# Patient Record
Sex: Male | Born: 1935 | Race: White | Hispanic: No | Marital: Married | Smoking: Never smoker
Health system: Southern US, Community
[De-identification: ages and names within clinical notes are randomized; demographics above are authoritative.]

## PROBLEM LIST (undated history)

## (undated) DIAGNOSIS — I480 Paroxysmal atrial fibrillation: Secondary | ICD-10-CM

## (undated) DIAGNOSIS — K219 Gastro-esophageal reflux disease without esophagitis: Secondary | ICD-10-CM

## (undated) DIAGNOSIS — I2510 Atherosclerotic heart disease of native coronary artery without angina pectoris: Secondary | ICD-10-CM

## (undated) DIAGNOSIS — J321 Chronic frontal sinusitis: Secondary | ICD-10-CM

## (undated) DIAGNOSIS — J42 Unspecified chronic bronchitis: Secondary | ICD-10-CM

## (undated) DIAGNOSIS — R7302 Impaired glucose tolerance (oral): Secondary | ICD-10-CM

## (undated) DIAGNOSIS — J449 Chronic obstructive pulmonary disease, unspecified: Secondary | ICD-10-CM

## (undated) DIAGNOSIS — M1990 Unspecified osteoarthritis, unspecified site: Secondary | ICD-10-CM

## (undated) DIAGNOSIS — N400 Enlarged prostate without lower urinary tract symptoms: Secondary | ICD-10-CM

## (undated) DIAGNOSIS — G4733 Obstructive sleep apnea (adult) (pediatric): Secondary | ICD-10-CM

## (undated) DIAGNOSIS — F419 Anxiety disorder, unspecified: Secondary | ICD-10-CM

## (undated) DIAGNOSIS — R079 Chest pain, unspecified: Secondary | ICD-10-CM

## (undated) DIAGNOSIS — Z9989 Dependence on other enabling machines and devices: Secondary | ICD-10-CM

## (undated) DIAGNOSIS — I252 Old myocardial infarction: Secondary | ICD-10-CM

## (undated) DIAGNOSIS — F0390 Unspecified dementia without behavioral disturbance: Secondary | ICD-10-CM

## (undated) DIAGNOSIS — R0600 Dyspnea, unspecified: Secondary | ICD-10-CM

## (undated) DIAGNOSIS — T23269A Burn of second degree of back of unspecified hand, initial encounter: Secondary | ICD-10-CM

## (undated) HISTORY — DX: Burn of second degree of back of unspecified hand, initial encounter: T23.269A

## (undated) HISTORY — DX: Unspecified chronic bronchitis: J42

## (undated) HISTORY — DX: Atherosclerotic heart disease of native coronary artery without angina pectoris: I25.10

## (undated) HISTORY — PX: HERNIA REPAIR: SHX51

## (undated) HISTORY — DX: Anxiety disorder, unspecified: F41.9

## (undated) HISTORY — DX: Chronic frontal sinusitis: J32.1

## (undated) HISTORY — DX: Old myocardial infarction: I25.2

## (undated) HISTORY — DX: Paroxysmal atrial fibrillation: I48.0

## (undated) HISTORY — DX: Unspecified osteoarthritis, unspecified site: M19.90

## (undated) HISTORY — PX: APPENDECTOMY: SHX54

## (undated) HISTORY — DX: Impaired glucose tolerance (oral): R73.02

## (undated) HISTORY — DX: Chest pain, unspecified: R07.9

## (undated) HISTORY — DX: Dependence on other enabling machines and devices: Z99.89

## (undated) HISTORY — PX: CARDIAC CATHETERIZATION: SHX172

## (undated) HISTORY — DX: Obstructive sleep apnea (adult) (pediatric): G47.33

---

## 2008-12-30 ENCOUNTER — Inpatient Hospital Stay: Payer: Self-pay | Admitting: Internal Medicine

## 2009-12-10 ENCOUNTER — Inpatient Hospital Stay: Payer: Self-pay | Admitting: Student

## 2010-08-20 DIAGNOSIS — I2510 Atherosclerotic heart disease of native coronary artery without angina pectoris: Secondary | ICD-10-CM

## 2010-08-20 HISTORY — DX: Atherosclerotic heart disease of native coronary artery without angina pectoris: I25.10

## 2010-12-04 ENCOUNTER — Ambulatory Visit: Payer: Self-pay | Admitting: Internal Medicine

## 2010-12-05 ENCOUNTER — Inpatient Hospital Stay: Payer: Self-pay | Admitting: Internal Medicine

## 2010-12-24 ENCOUNTER — Ambulatory Visit: Payer: Self-pay | Admitting: Internal Medicine

## 2010-12-26 ENCOUNTER — Ambulatory Visit: Payer: Self-pay | Admitting: Internal Medicine

## 2011-03-16 DIAGNOSIS — Z9989 Dependence on other enabling machines and devices: Secondary | ICD-10-CM | POA: Insufficient documentation

## 2011-03-16 DIAGNOSIS — I480 Paroxysmal atrial fibrillation: Secondary | ICD-10-CM | POA: Insufficient documentation

## 2011-03-16 DIAGNOSIS — G4700 Insomnia, unspecified: Secondary | ICD-10-CM | POA: Insufficient documentation

## 2011-03-16 DIAGNOSIS — J42 Unspecified chronic bronchitis: Secondary | ICD-10-CM | POA: Insufficient documentation

## 2011-03-16 DIAGNOSIS — I482 Chronic atrial fibrillation, unspecified: Secondary | ICD-10-CM | POA: Insufficient documentation

## 2011-03-16 DIAGNOSIS — I2510 Atherosclerotic heart disease of native coronary artery without angina pectoris: Secondary | ICD-10-CM

## 2011-03-16 DIAGNOSIS — G4733 Obstructive sleep apnea (adult) (pediatric): Secondary | ICD-10-CM | POA: Insufficient documentation

## 2011-03-16 HISTORY — DX: Paroxysmal atrial fibrillation: I48.0

## 2011-03-16 HISTORY — DX: Atherosclerotic heart disease of native coronary artery without angina pectoris: I25.10

## 2011-03-16 HISTORY — DX: Obstructive sleep apnea (adult) (pediatric): G47.33

## 2011-03-16 HISTORY — DX: Unspecified chronic bronchitis: J42

## 2011-10-16 ENCOUNTER — Ambulatory Visit: Payer: Self-pay | Admitting: Specialist

## 2012-03-05 DIAGNOSIS — Z79899 Other long term (current) drug therapy: Secondary | ICD-10-CM | POA: Insufficient documentation

## 2012-04-10 ENCOUNTER — Ambulatory Visit: Payer: Self-pay | Admitting: Medical

## 2012-04-10 ENCOUNTER — Observation Stay: Payer: Self-pay | Admitting: Internal Medicine

## 2012-04-10 LAB — COMPREHENSIVE METABOLIC PANEL
Anion Gap: 7 (ref 7–16)
BUN: 19 mg/dL — ABNORMAL HIGH (ref 7–18)
Bilirubin,Total: 0.6 mg/dL (ref 0.2–1.0)
Chloride: 108 mmol/L — ABNORMAL HIGH (ref 98–107)
Co2: 28 mmol/L (ref 21–32)
Creatinine: 0.95 mg/dL (ref 0.60–1.30)
EGFR (African American): >60
EGFR (Non-African Amer.): >60
Glucose: 99 mg/dL (ref 65–99)
Osmolality: 287 (ref 275–301)
Potassium: 4 mmol/L (ref 3.5–5.1)
Sodium: 143 mmol/L (ref 136–145)

## 2012-04-10 LAB — CBC WITH DIFFERENTIAL/PLATELET
Basophil #: 0.1 10*3/uL (ref 0.0–0.1)
Basophil %: 1 %
Eosinophil #: 0.5 10*3/uL (ref 0.0–0.7)
HCT: 41.8 % (ref 40.0–52.0)
HGB: 14.1 g/dL (ref 13.0–18.0)
Lymphocyte #: 1.1 10*3/uL (ref 1.0–3.6)
Lymphocyte %: 15.6 %
MCH: 32.1 pg (ref 26.0–34.0)
MCHC: 33.8 g/dL (ref 32.0–36.0)
Monocyte #: 0.6 x10 3/mm (ref 0.2–1.0)
Neutrophil %: 66.5 %
RBC: 4.4 10*6/uL (ref 4.40–5.90)

## 2012-04-10 LAB — APTT: Activated PTT: 36.3 secs — ABNORMAL HIGH (ref 23.6–35.9)

## 2012-04-11 LAB — TROPONIN I: Troponin-I: <0.02 ng/mL

## 2012-07-28 DIAGNOSIS — F419 Anxiety disorder, unspecified: Secondary | ICD-10-CM | POA: Insufficient documentation

## 2012-07-28 HISTORY — DX: Anxiety disorder, unspecified: F41.9

## 2012-08-20 HISTORY — PX: CARDIOVASCULAR STRESS TEST: SHX262

## 2013-01-23 ENCOUNTER — Ambulatory Visit: Payer: Self-pay

## 2013-01-30 ENCOUNTER — Ambulatory Visit: Payer: Self-pay | Admitting: Family Medicine

## 2013-02-03 ENCOUNTER — Encounter: Payer: Self-pay | Admitting: Family Medicine

## 2013-06-23 ENCOUNTER — Ambulatory Visit: Payer: Self-pay | Admitting: Emergency Medicine

## 2013-07-27 DIAGNOSIS — T23269A Burn of second degree of back of unspecified hand, initial encounter: Secondary | ICD-10-CM | POA: Insufficient documentation

## 2013-07-27 HISTORY — DX: Burn of second degree of back of unspecified hand, initial encounter: T23.269A

## 2014-03-03 DIAGNOSIS — R413 Other amnesia: Secondary | ICD-10-CM | POA: Insufficient documentation

## 2014-05-17 DIAGNOSIS — I252 Old myocardial infarction: Secondary | ICD-10-CM

## 2014-05-17 DIAGNOSIS — J449 Chronic obstructive pulmonary disease, unspecified: Secondary | ICD-10-CM | POA: Insufficient documentation

## 2014-05-17 HISTORY — DX: Old myocardial infarction: I25.2

## 2014-05-19 DIAGNOSIS — I25709 Atherosclerosis of coronary artery bypass graft(s), unspecified, with unspecified angina pectoris: Secondary | ICD-10-CM | POA: Insufficient documentation

## 2014-09-20 ENCOUNTER — Emergency Department: Payer: Self-pay | Admitting: Emergency Medicine

## 2014-09-20 LAB — CBC
HCT: 42.9 % (ref 40.0–52.0)
HGB: 14.5 g/dL (ref 13.0–18.0)
MCH: 32.5 pg (ref 26.0–34.0)
MCHC: 33.7 g/dL (ref 32.0–36.0)
MCV: 96 fL (ref 80–100)
Platelet: 202 10*3/uL (ref 150–440)
RBC: 4.44 10*6/uL (ref 4.40–5.90)
RDW: 14.4 % (ref 11.5–14.5)
WBC: 5.6 10*3/uL (ref 3.8–10.6)

## 2014-09-20 LAB — COMPREHENSIVE METABOLIC PANEL
ALBUMIN: 3.5 g/dL (ref 3.4–5.0)
Alkaline Phosphatase: 74 U/L (ref 46–116)
Anion Gap: 8 (ref 7–16)
BUN: 14 mg/dL (ref 7–18)
Bilirubin,Total: 0.5 mg/dL (ref 0.2–1.0)
CALCIUM: 9.4 mg/dL (ref 8.5–10.1)
CO2: 27 mmol/L (ref 21–32)
Chloride: 105 mmol/L (ref 98–107)
Creatinine: 1.11 mg/dL (ref 0.60–1.30)
EGFR (African American): >60
EGFR (Non-African Amer.): >60
GLUCOSE: 104 mg/dL — AB (ref 65–99)
Osmolality: 280 (ref 275–301)
POTASSIUM: 3.8 mmol/L (ref 3.5–5.1)
SGOT(AST): 19 U/L (ref 15–37)
SGPT (ALT): 24 U/L (ref 14–63)
Sodium: 140 mmol/L (ref 136–145)
Total Protein: 7.2 g/dL (ref 6.4–8.2)

## 2014-09-20 LAB — TROPONIN I: Troponin-I: <0.02 ng/mL

## 2014-09-20 LAB — CK TOTAL AND CKMB (NOT AT ARMC)
CK, TOTAL: 98 U/L (ref 39–308)
CK-MB: 1.8 ng/mL (ref 0.5–3.6)

## 2014-11-08 ENCOUNTER — Ambulatory Visit: Payer: Self-pay | Admitting: Ophthalmology

## 2014-12-07 NOTE — Discharge Summary (Signed)
PATIENT NAME:  Keith Estes, [name]:  [id number] DATE OF BIRTH:  09-18-1928  DATE OF ADMISSION:  04/10/2012 DATE OF DISCHARGE:  04/11/2012  PRIMARY CARE PHYSICIAN: Dr. Rolm GalaHeidi Grandis  CARDIOLOGIST: Dr. Juliann Paresallwood  DISCHARGE DIAGNOSES:  1. Noncardiac chest pain, likely secondary to chronic obstructive pulmonary disease.  2. Coronary artery disease.  3. Chronic atrial fibrillation.  4. Stable chronic obstructive pulmonary disease.  5. Anxiety.   LABORATORY, DIAGNOSTIC AND RADIOLOGICAL DATA: Chest x-ray showed changes from chronic obstructive pulmonary disease. No acute abnormalities.   ADMITTING HISTORY AND PHYSICAL: Please see detailed history and physical dictated by Dr. Cherlynn Estes. In brief, 81 year old male patient with history of coronary artery disease who follows with Dr. Juliann Paresallwood of cardiology presented to the Emergency Room complaining of left-sided chest pain which is pleuritic but no shortness of breath, palpitations or lightheadedness. Did not have any swelling in his legs. No long car ride or flight journeys. No prior history of PE. Patient was admitted to the hospital for ruling out acute coronary syndrome.   HOSPITAL COURSE: Sinus chest pain. Patient's chest pain had significantly decreased by the time of discharge. He did not have any chest pain at rest but had some pain on deep breathing. Did not have any palpitations, shortness of breath. Telemetry showed no arrhythmias. EKG showed no acute changes. Cardiac enzymes have been normal. Chest x-ray showed chronic obstructive pulmonary disease and his chest pain was thought to be secondary to chronic obstructive pulmonary disease and patient is being discharged home in a stable condition to follow up with his primary care physician and cardiologist. On the day of discharge patient's temperature 96.6, heart rate 72, blood pressure 120/76, saturating 98% on room air and is discharged home in stable condition.   DISCHARGE MEDICATIONS:  1. Coreg  12.5 mg oral twice a day.  2. Cholecalciferol 1 tablet oral once a day.  3. Dofetilide 500 mcg 1 capsule oral every 12 hours.  4. Donepezil 10 mg oral once a day.  5. Ranitidine 150 mg oral twice a day.  6. Spiriva 18 mcg inhalation once a day.  7. Ventolin HFA 2 puffs inhaled 3 times a day.  8. Alprazolam 0.5 mg oral once a day as needed.  9. Fluticasone one spray in each nostril twice a day.  10. Nitroglycerin 0.4 mg sublingual as needed for chest pain.  11. Ascorbic acid 1 tablet oral once a day.  12. Pradaxa 150 mg oral twice a day.  13. Ranexa 500 mg oral 2 times a day.   DISCHARGE INSTRUCTIONS: Follow up with primary care physician, Dr. Rolm GalaHeidi Grandis, within a week and follow up with Dr. Juliann Paresallwood in 2 to 3 weeks. Patient will be on a cardiac diet with activity as tolerated with assistance. Patient is to return to the Emergency Room if she has any worsening shortness of breath or chest pain. This plan was discussed with the patient and his wife at bedside who verbalized understanding and are okay with the plan.   TIME SPENT: Time spent today on this discharge dictation along with coordinating care and counseling of the patient was 33 minutes.   ____________________________ Molinda BailiffSrikar R. Azalia Neuberger, MD srs:cms D: 04/11/2012 11:11:16 ET T: 04/11/2012 11:25:40 ET JOB#: [id number]  cc: Wardell HeathSrikar R. Elpidio AnisSudini, MD, <Dictator> Letitia CaulHeidi M. Grandis, MD Dwayne D. Juliann Paresallwood, MD Orie FishermanSRIKAR R Carlos Quackenbush MD ELECTRONICALLY SIGNED 04/18/2012 5:35

## 2014-12-07 NOTE — H&P (Signed)
PATIENT NAME:  Keith Estes, [name] MR#:  [id number] DATE OF BIRTH:  02/18/1929  DATE OF ADMISSION:  04/10/2012  PRIMARY CARE PHYSICIAN: Dr. Rolm GalaHeidi Grandis  CARDIOLOGIST: Dr. Juliann Paresallwood here locally but [facility]   CHIEF COMPLAINT: Chest pain.   HISTORY OF PRESENT ILLNESS: This is an 81 year old male who presents to the Emergency Room secondary to chest pain. Patient says he started developing chest pain earlier this morning, has been having intermittent chest pain on and off this entire day. Patient woke up this morning and had the chest pain. He describes it as sharp pain in the left side of the chest, nonradiating, not associated with any shortness of breath. No nausea, no vomiting, no diaphoresis, no palpitations, no syncope. Patient says that this pain is somewhat similar to his anginal pain that he has had in the past. He does have a significant cardiac history with multiple stents in the past. Given his significant risk factors hospitalist service was contacted for further treatment and evaluation.   REVIEW OF SYSTEMS: CONSTITUTIONAL: No documented fever. No weight gain, no weight loss. EYES: No blurred or double vision. ENT: No tinnitus. No postnasal drip. No redness of the oropharynx. RESPIRATORY: No cough, no wheeze, no hemoptysis, no dyspnea. CARDIOVASCULAR: Positive chest pain. No orthopnea, no palpitations, no syncope. GASTROINTESTINAL: No nausea, no vomiting, no diarrhea, no abdominal pain, no melena, no hematochezia. GENITOURINARY: No dysuria, no hematuria. ENDOCRINE: No polyuria or nocturia. No heat or cold intolerance. HEME: No anemia, no bruising, no bleeding. INTEGUMENTARY: No rashes. No lesions. MUSCULOSKELETAL: No arthritis, no swelling, no gout. NEUROLOGIC: No numbness, no tingling, no ataxia, no seizure-type activity. PSYCH: No anxiety, no insomnia, no ADD.   PAST MEDICAL HISTORY:  1. Coronary artery disease, status post multiple  stents. 2. History of chronic atrial fibrillation. 3. Dementia. 4. Chronic obstructive pulmonary disease. 5. Gastroesophageal reflux disease.  6. Hyperlipidemia.  7. Anxiety.   ALLERGIES: Lipitor, lisinopril, Percodan and Vicodin.   SOCIAL HISTORY: Patient denies any smoking, any alcohol abuse. No illicit drug abuse. Lives at home with his wife.   FAMILY HISTORY: Patient's mother died apparently from starvation. His father had chronic obstructive pulmonary disease.   CURRENT MEDICATIONS:  1. Albuterol inhaler 2 puffs t.i.d. as needed.  2. Xanax 0.5 mg at bedtime.  3. Vitamin C 500 mg daily.  4. Coreg 12.5 mg b.i.d.  5. Coenzyme Q 200 mg daily. 6. Pradaxa 150 mg b.i.d.  7. Dofetilide 500 mcg 1 cap b.i.d.  8. Aricept 10 mg daily.  9. Flonase one spray to both nostrils daily.  10. Ranitidine 150 mg b.i.d.  11. Ranexa 500 mg b.i.d.  12. Pravachol 20 mg daily.  13. Trazodone 50 mg at bedtime as needed.  14. Vitamin D3 1000 international units daily.  15. Vitamin B complex daily.  16. Vitamin C 1 tab b.i.d.  17. Patanol 1 drop b.i.d. to affected eye.  18. Sublingual nitroglycerin as needed.  19. Garlic supplement. 20. Herbal supplement.   PHYSICAL EXAMINATION:  VITAL SIGNS: Temperature 96.4, pulse 60, respirations 16, blood pressure 142/82, sats 95% on room air.   GENERAL: Patient is a pleasant appearing male in no apparent distress.   HEENT: Atraumatic, normocephalic. Extraocular muscles are intact. Pupils equal, reactive to light. Sclerae anicteric. No conjunctival injection. No oropharyngeal erythema.   NECK: Supple. There is no jugular venous distention. No bruits, no lymphadenopathy, no thyromegaly.   HEART: Regular rate and rhythm. No murmurs, no rubs, no  clicks.   LUNGS: Clear to auscultation bilaterally. No rales, no rhonchi, no wheezes.   ABDOMEN: Soft, flat, nontender, nondistended. Has good bowel sounds. No hepatosplenomegaly appreciated.   EXTREMITIES: No  evidence of any cyanosis, clubbing, or peripheral edema. Has +2 pedal and radial pulses bilaterally.   NEUROLOGICAL: Patient is alert, awake, oriented x3 with no focal motor or sensory deficits appreciated bilaterally.   SKIN: Moist, warm with no rash appreciated.   LYMPHATIC: There is no cervical or axillary lymphadenopathy.   LABORATORY, DIAGNOSTIC, AND RADIOLOGICAL DATA: Serum glucose 99, BUN 19, creatinine 0.9, sodium 143, potassium 4, chloride 108, bicarbonate 28. LFTs are within normal limits. Troponin less than 0.02, white cell count 6.9, hemoglobin 14.1, hematocrit 41.8, platelet count 199.   ASSESSMENT AND PLAN: This is an 81 year old male with a history of coronary artery disease status post multiple stents, history of chronic atrial fibrillation, hypertension, chronic obstructive pulmonary disease, hyperlipidemia presents to the hospital with chest pain.  1. Chest pain. The exact etiology currently unclear but suspicious for angina given his significant risk factors and history of multiple stents. I will observe him overnight on telemetry. Follow serial cardiac markers. His first set is negative. EKG shows no acute ST changes. I will continue his Ranexa, beta blocker, sublingual nitroglycerin, Coreg and Pravachol for now. Patient has had a catheterization in April 2012 done by Dr. Juliann Pares showing some moderate coronary disease but patent stents. At that point he recommended medical management. If needed will consult cardiology again.  2. History of chronic atrial fibrillation, currently in sinus rhythm. Continue with dofetilide, Coreg and continue Pradaxa.  3. Chronic obstructive pulmonary disease. No evidence of any acute exacerbation. Continue Spiriva and p.r.n. albuterol inhaler.  4. Anxiety. Continue Xanax. 5. Hyperlipidemia. Continue Pravachol.  6. Hypertension. Continue Coreg.  7. Gastroesophageal reflux disease. Continue ranitidine.  8. CODE STATUS: Patient is a FULL CODE.    TIME SPENT: 50 minutes.   ____________________________ Rolly Pancake. Cherlynn Kaiser, MD vjs:cms D: 04/10/2012 21:32:58 ET T: 04/11/2012 06:20:23 ET JOB#: [id number]  cc: Rolly Pancake. Cherlynn Kaiser, MD, <Dictator> Letitia Caul, MD Houston Siren MD ELECTRONICALLY SIGNED 04/16/2012 22:33

## 2016-02-03 ENCOUNTER — Encounter: Payer: Self-pay | Admitting: *Deleted

## 2016-02-03 ENCOUNTER — Ambulatory Visit
Admission: EM | Admit: 2016-02-03 | Discharge: 2016-02-03 | Disposition: A | Discharge status: Home / Self Care | Payer: Medicare Other | Attending: Family Medicine | Admitting: Family Medicine

## 2016-02-03 DIAGNOSIS — J069 Acute upper respiratory infection, unspecified: Secondary | ICD-10-CM | POA: Diagnosis not present

## 2016-02-03 HISTORY — DX: Atherosclerotic heart disease of native coronary artery without angina pectoris: I25.10

## 2016-02-03 HISTORY — DX: Chronic obstructive pulmonary disease, unspecified: J44.9

## 2016-02-03 MED ORDER — AMOXICILLIN-POT CLAVULANATE 875-125 MG PO TABS: 1.0000 | ORAL_TABLET | Freq: Two times a day (BID) | ORAL | Status: DC

## 2016-02-03 MED ORDER — DOXYCYCLINE HYCLATE 100 MG PO CAPS: 100.0000 mg | ORAL_CAPSULE | Freq: Two times a day (BID) | ORAL | Status: DC

## 2016-02-03 NOTE — ED Notes (Signed)
Productive cough- brown, runny nose, chest soreness r/t coughing, body aches, x1 week.

## 2016-02-03 NOTE — Discharge Instructions (Signed)
Take oral antihistamine such as Claritin for postnasal drip and also Delsym for cough as needed.

## 2016-02-03 NOTE — ED Provider Notes (Signed)
CSN: [id number]     Arrival date & time 02/03/16  0919 History   First MD Initiated Contact with Patient 02/03/16 [id number]     Chief Complaint  Patient presents with  . Cough  . Nasal Congestion  . Generalized Body Aches   (Consider location/radiation/quality/duration/timing/severity/associated sxs/prior Treatment) HPI: Patient presents today with symptoms of rhinorrhea, cough for the last week. Patient states that the mucus is colored. He denies any chest pain or shortness of breath. Dr. Meredeth IdeFleming is his pulmonologist for his COPD. He does use his nebulizer once or twice daily when he has URIs. He has not taken any other medications other medications for his symptoms.Patient was on Augmentin for a week for a dental problem last week.  Past Medical History  Diagnosis Date  . COPD (chronic obstructive pulmonary disease) (HCC)   . Coronary artery disease    Past Surgical History  Procedure Laterality Date  . Hernia repair    . Appendectomy     History reviewed. No pertinent family history. Social History  Substance Use Topics  . Smoking status: Never Smoker   . Smokeless tobacco: None  . Alcohol Use: No    Review of Systems: Negative except mentioned above.   Allergies  Diltiazem; Hydrocodone; Lipitor; Lisinopril; Percodan; and Vicodin  Home Medications   Prior to Admission medications   Medication Sig Start Date End Date Taking? Authorizing Provider  albuterol (PROVENTIL) (2.5 MG/3ML) 0.083% nebulizer solution Take 2.5 mg by nebulization every 6 (six) hours as needed for wheezing or shortness of breath.   Yes Historical Provider, MD  ALPRAZolam Prudy Feeler(XANAX) 0.5 MG tablet Take 0.5 mg by mouth at bedtime as needed for anxiety.   Yes Historical Provider, MD  aspirin 81 MG tablet Take 81 mg by mouth daily.   Yes Historical Provider, MD  azelastine (ASTELIN) 0.1 % nasal spray Place into both nostrils 2 (two) times daily. Use in each nostril as directed   Yes Historical Provider, MD   carvedilol (COREG) 12.5 MG tablet Take 12.5 mg by mouth 2 (two) times daily with a meal.   Yes Historical Provider, MD  Cholecalciferol 10000 units TABS Take by mouth.   Yes Historical Provider, MD  dabigatran (PRADAXA) 150 MG CAPS capsule Take 150 mg by mouth 2 (two) times daily.   Yes Historical Provider, MD  dofetilide (TIKOSYN) 500 MCG capsule Take 500 mcg by mouth 2 (two) times daily.   Yes Historical Provider, MD  donepezil (ARICEPT) 10 MG tablet Take 10 mg by mouth at bedtime.   Yes Historical Provider, MD  Multiple Vitamins-Minerals (PRESERVISION AREDS 2 PO) Take by mouth.   Yes Historical Provider, MD  nitroGLYCERIN (NITROSTAT) 0.4 MG SL tablet Place 0.4 mg under the tongue every 5 (five) minutes as needed for chest pain.   Yes Historical Provider, MD  Omega-3 Fatty Acids (FISH OIL) 500 MG CAPS Take by mouth.   Yes Historical Provider, MD  pravastatin (PRAVACHOL) 10 MG tablet Take 10 mg by mouth daily.   Yes Historical Provider, MD  Propylene Glycol (SYSTANE BALANCE) 0.6 % SOLN Apply to eye.   Yes Historical Provider, MD  ranitidine (ZANTAC) 150 MG tablet Take 150 mg by mouth 2 (two) times daily.   Yes Historical Provider, MD  tamsulosin (FLOMAX) 0.4 MG CAPS capsule Take 0.4 mg by mouth.   Yes Historical Provider, MD  thiamine (VITAMIN B-1) 50 MG tablet Take 50 mg by mouth daily.   Yes Historical Provider, MD  amoxicillin-clavulanate (AUGMENTIN) 875-125 MG tablet  Take 1 tablet by mouth every 12 (twelve) hours. 02/03/16   Jolene Provost, MD   Meds Ordered and Administered this Visit  Medications - No data to display  BP 123/83 mmHg  Pulse 70  Temp(Src) 98.4 F (36.9 C)  Resp 18  Ht  (1.702 m)  Wt 200 lb (90.719 kg)  BMI 31.32 kg/m2  SpO2 97% No data found.   Physical Exam   GENERAL: NAD HEENT: mild pharyngeal erythema, no exudate, no erythema of TMs, no cervical LAD RESP: minimal wheeze at bases, no tachypnea appreciated, no accessory muscle use  CARD: RRR NEURO: CN  II-XII grossly intact   ED Course  Procedures (including critical care time)  Labs Review Labs Reviewed - No data to display  Imaging Review No results found.    MDM   1. URI, acute   Will treat with Doxycycline, continue nebulizer treatment as needed, Claritin when necessary, Delsym when necessary, follow-up with Dr. Meredeth Ide next week as scheduled. If symptoms worsen recommend going to the ER this weekend.    Jolene Provost, MD 02/03/16 1039

## 2016-03-02 ENCOUNTER — Encounter: Payer: Self-pay | Admitting: Emergency Medicine

## 2016-03-02 ENCOUNTER — Ambulatory Visit
Admission: EM | Admit: 2016-03-02 | Discharge: 2016-03-02 | Disposition: A | Discharge status: Home / Self Care | Payer: Medicare Other | Attending: Emergency Medicine | Admitting: Emergency Medicine

## 2016-03-02 DIAGNOSIS — Z7982 Long term (current) use of aspirin: Secondary | ICD-10-CM | POA: Diagnosis not present

## 2016-03-02 DIAGNOSIS — J029 Acute pharyngitis, unspecified: Secondary | ICD-10-CM | POA: Diagnosis not present

## 2016-03-02 DIAGNOSIS — Z7901 Long term (current) use of anticoagulants: Secondary | ICD-10-CM | POA: Diagnosis not present

## 2016-03-02 DIAGNOSIS — J449 Chronic obstructive pulmonary disease, unspecified: Secondary | ICD-10-CM | POA: Diagnosis not present

## 2016-03-02 DIAGNOSIS — Z885 Allergy status to narcotic agent status: Secondary | ICD-10-CM | POA: Diagnosis not present

## 2016-03-02 DIAGNOSIS — I2510 Atherosclerotic heart disease of native coronary artery without angina pectoris: Secondary | ICD-10-CM | POA: Insufficient documentation

## 2016-03-02 DIAGNOSIS — Z9889 Other specified postprocedural states: Secondary | ICD-10-CM | POA: Insufficient documentation

## 2016-03-02 LAB — RAPID STREP SCREEN (MED CTR MEBANE ONLY): STREPTOCOCCUS, GROUP A SCREEN (DIRECT): NEGATIVE

## 2016-03-02 NOTE — ED Notes (Signed)
Patient c/o sore throat for 3 days.  Patient denies fevers.  

## 2016-03-02 NOTE — ED Provider Notes (Addendum)
HPI  SUBJECTIVE:  Patient reports sore throat starting 3 days ago. Sx worse with swallowing.  Sx better with nothing. Has not tried anything for this. No Fever + Swollen neck glands    Reports postnasal drip, but states this is chronic. No Cough/URI sxs No Myalgias No Headache No Rash     No Recent Strep Exposure No Abdominal Pain No reflux sxs No Allergy sxs  No Breathing difficulty, voice changes No Drooling No Trismus + abx in past month was on doxycycline for URI/bronchitis recently. No antipyretic in past 4-6 hrs. Pt is not a smoker.  Past medical history of GERD, allergies, atrial fibrillation, coronary artery disease status post 5 stents, hypertension. No history of strep, mono, smoking, diabetes   .  Past Medical History  Diagnosis Date  . COPD (chronic obstructive pulmonary disease) (HCC)   . Coronary artery disease     Past Surgical History  Procedure Laterality Date  . Hernia repair    . Appendectomy      History reviewed. No pertinent family history.  Social History  Substance Use Topics  . Smoking status: Never Smoker   . Smokeless tobacco: None  . Alcohol Use: No    No current facility-administered medications for this encounter.  Current outpatient prescriptions:  .  albuterol (PROVENTIL) (2.5 MG/3ML) 0.083% nebulizer solution, Take 2.5 mg by nebulization every 6 (six) hours as needed for wheezing or shortness of breath., Disp: , Rfl:  .  ALPRAZolam (XANAX) 0.5 MG tablet, Take 0.5 mg by mouth at bedtime as needed for anxiety., Disp: , Rfl:  .  aspirin 81 MG tablet, Take 81 mg by mouth daily., Disp: , Rfl:  .  azelastine (ASTELIN) 0.1 % nasal spray, Place into both nostrils 2 (two) times daily. Use in each nostril as directed, Disp: , Rfl:  .  carvedilol (COREG) 12.5 MG tablet, Take 12.5 mg by mouth 2 (two) times daily with a meal., Disp: , Rfl:  .  Cholecalciferol 10000 units TABS, Take by mouth., Disp: , Rfl:  .  dabigatran (PRADAXA) 150 MG  CAPS capsule, Take 150 mg by mouth 2 (two) times daily., Disp: , Rfl:  .  dofetilide (TIKOSYN) 500 MCG capsule, Take 500 mcg by mouth 2 (two) times daily., Disp: , Rfl:  .  donepezil (ARICEPT) 10 MG tablet, Take 10 mg by mouth at bedtime., Disp: , Rfl:  .  Multiple Vitamins-Minerals (PRESERVISION AREDS 2 PO), Take by mouth., Disp: , Rfl:  .  nitroGLYCERIN (NITROSTAT) 0.4 MG SL tablet, Place 0.4 mg under the tongue every 5 (five) minutes as needed for chest pain., Disp: , Rfl:  .  Omega-3 Fatty Acids (FISH OIL) 500 MG CAPS, Take by mouth., Disp: , Rfl:  .  pravastatin (PRAVACHOL) 10 MG tablet, Take 10 mg by mouth daily., Disp: , Rfl:  .  Propylene Glycol (SYSTANE BALANCE) 0.6 % SOLN, Apply to eye., Disp: , Rfl:  .  ranitidine (ZANTAC) 150 MG tablet, Take 150 mg by mouth 2 (two) times daily., Disp: , Rfl:  .  tamsulosin (FLOMAX) 0.4 MG CAPS capsule, Take 0.4 mg by mouth., Disp: , Rfl:  .  thiamine (VITAMIN B-1) 50 MG tablet, Take 50 mg by mouth daily., Disp: , Rfl:   Allergies  Allergen Reactions  . Diltiazem Other (See Comments)    Headache, GI upset  . Hydrocodone Nausea And Vomiting  . Lipitor [Atorvastatin] Other (See Comments)    Myalgias  . Lisinopril Cough  . Percodan [Oxycodone-Aspirin] Nausea  And Vomiting  . Vicodin [Hydrocodone-Acetaminophen] Nausea And Vomiting     ROS  As noted in HPI.   Physical Exam  BP 138/74 mmHg  Pulse 58  Temp(Src) 97.7 F (36.5 C) (Oral)  Resp 17  Ht  (1.702 m)  Wt 200 lb (90.719 kg)  BMI 31.32 kg/m2  SpO2 97%  Constitutional: Well developed, well nourished, no acute distress Eyes:  EOMI, conjunctiva normal bilaterally HENT: Normocephalic, atraumatic,mucus membranes moist. + nasal congestion +  erythematous oropharynx - enlarged tonsils  - exudates. Uvula midline. No postnasal drip noted Respiratory: Normal inspiratory effort Cardiovascular: Normal rate, no murmurs, rubs, gallops GI: nondistended, nontender. No appreciable  splenomegaly skin: No rash, skin intact Lymph: -cervical LN  Musculoskeletal: no deformities Neurologic: Alert & oriented x 3, no focal neuro deficits Psychiatric: Speech and behavior appropriate.   ED Course   Medications - No data to display  Orders Placed This Encounter  Procedures  . Rapid strep screen    Standing Status: Standing     Number of Occurrences: 1     Standing Expiration Date:   . Culture, group A strep    Standing Status: Standing     Number of Occurrences: 1     Standing Expiration Date:     Results for orders placed or performed during the hospital encounter of 03/02/16 (from the past 24 hour(s))  Rapid strep screen     Status: None   Collection Time: 03/02/16 10:33 AM  Result Value Ref Range   Streptococcus, Group A Screen (Direct) NEGATIVE NEGATIVE   No results found.  ED Clinical Impression  Pharyngitis   ED Assessment/Plan   Rapid strep negative. Obtaining throat culture to guide antibiotic treatment. Most likely a viral pharyngitis, although irritation from postnasal drip versus GERD is in the differential. Discussed this with patient. We'll contact them if culture is positive, and will call in Appropriate antibiotics. Patient home with Tylenol, Benadryl/Maalox mixture, salt water gargles. Patient to followup with PMD when necessary,   Discussed labs,  MDM, plan and followup with patient . Discussed sn/sx that should prompt return to the  ED. Patient agrees with plan.   *This clinic note was created using Dragon dictation software. Therefore, there may be occasional mistakes despite careful proofreading.    Domenick Gong, MD 03/02/16 1116  Domenick Gong, MD 03/02/16 1314

## 2016-03-02 NOTE — Discharge Instructions (Signed)
your rapid strep was negative today, so we have sent off a throat culture.  We will contact you and call in the appropriate antibiotics if your culture comes back positive for an infection requiring antibiotic treatment.  Give us a working phone number. Tylenol as needed for pain.  Make sure you drink plenty of extra fluids.  Some people find salt water gargles and  Traditional Medic[employer]'s "Throat Coat" tea helpful. Take 5 mL of liquid Benadryl and 5 mL of Maalox. Mix it together, and then hold it in your mouth for as long as you can and then swallow. You may do this 4 times a day.   ° °Go to www.goodrx.com to look up your medications. This will give you a list of where you can find your prescriptions at the most affordable prices. °

## 2016-03-04 LAB — CULTURE, GROUP A STREP (THRC)

## 2016-04-29 ENCOUNTER — Inpatient Hospital Stay
Admission: EM | Admit: 2016-04-29 | Discharge: 2016-05-01 | DRG: 309 | Disposition: A | Discharge status: Home / Self Care | Payer: Medicare Other | Attending: Internal Medicine | Admitting: Internal Medicine

## 2016-04-29 ENCOUNTER — Encounter: Payer: Self-pay | Admitting: Internal Medicine

## 2016-04-29 DIAGNOSIS — I4891 Unspecified atrial fibrillation: Secondary | ICD-10-CM

## 2016-04-29 DIAGNOSIS — Z9049 Acquired absence of other specified parts of digestive tract: Secondary | ICD-10-CM | POA: Diagnosis not present

## 2016-04-29 DIAGNOSIS — J449 Chronic obstructive pulmonary disease, unspecified: Secondary | ICD-10-CM | POA: Diagnosis present

## 2016-04-29 DIAGNOSIS — N400 Enlarged prostate without lower urinary tract symptoms: Secondary | ICD-10-CM | POA: Diagnosis present

## 2016-04-29 DIAGNOSIS — Z955 Presence of coronary angioplasty implant and graft: Secondary | ICD-10-CM | POA: Diagnosis not present

## 2016-04-29 DIAGNOSIS — R079 Chest pain, unspecified: Secondary | ICD-10-CM

## 2016-04-29 DIAGNOSIS — Z888 Allergy status to other drugs, medicaments and biological substances status: Secondary | ICD-10-CM | POA: Diagnosis not present

## 2016-04-29 DIAGNOSIS — F0390 Unspecified dementia without behavioral disturbance: Secondary | ICD-10-CM | POA: Diagnosis present

## 2016-04-29 DIAGNOSIS — I25119 Atherosclerotic heart disease of native coronary artery with unspecified angina pectoris: Secondary | ICD-10-CM | POA: Diagnosis present

## 2016-04-29 DIAGNOSIS — Z79899 Other long term (current) drug therapy: Secondary | ICD-10-CM | POA: Diagnosis not present

## 2016-04-29 DIAGNOSIS — I248 Other forms of acute ischemic heart disease: Secondary | ICD-10-CM | POA: Diagnosis present

## 2016-04-29 DIAGNOSIS — I472 Ventricular tachycardia, unspecified: Secondary | ICD-10-CM

## 2016-04-29 DIAGNOSIS — E876 Hypokalemia: Secondary | ICD-10-CM | POA: Diagnosis present

## 2016-04-29 DIAGNOSIS — Z885 Allergy status to narcotic agent status: Secondary | ICD-10-CM | POA: Diagnosis not present

## 2016-04-29 DIAGNOSIS — Z7982 Long term (current) use of aspirin: Secondary | ICD-10-CM

## 2016-04-29 DIAGNOSIS — I10 Essential (primary) hypertension: Secondary | ICD-10-CM | POA: Diagnosis present

## 2016-04-29 DIAGNOSIS — I480 Paroxysmal atrial fibrillation: Secondary | ICD-10-CM | POA: Diagnosis present

## 2016-04-29 DIAGNOSIS — Z8249 Family history of ischemic heart disease and other diseases of the circulatory system: Secondary | ICD-10-CM

## 2016-04-29 DIAGNOSIS — G4733 Obstructive sleep apnea (adult) (pediatric): Secondary | ICD-10-CM | POA: Diagnosis present

## 2016-04-29 HISTORY — DX: Chest pain, unspecified: R07.9

## 2016-04-29 LAB — BASIC METABOLIC PANEL
Anion gap: 8 (ref 5–15)
BUN: 27 mg/dL — AB (ref 6–20)
CO2: 24 mmol/L (ref 22–32)
CREATININE: 1.06 mg/dL (ref 0.61–1.24)
Calcium: 9.1 mg/dL (ref 8.9–10.3)
Chloride: 107 mmol/L (ref 101–111)
GFR calc Af Amer: >60 mL/min (ref >60)
Glucose, Bld: 114 mg/dL — ABNORMAL HIGH (ref 65–99)
POTASSIUM: 3.8 mmol/L (ref 3.5–5.1)
Sodium: 139 mmol/L (ref 135–145)

## 2016-04-29 LAB — CBC
HCT: 39.9 % — ABNORMAL LOW (ref 40.0–52.0)
Hemoglobin: 14.3 g/dL (ref 13.0–18.0)
MCH: 33.9 pg (ref 26.0–34.0)
MCHC: 35.7 g/dL (ref 32.0–36.0)
MCV: 95.1 fL (ref 80.0–100.0)
Platelets: 168 10*3/uL (ref 150–440)
RBC: 4.2 MIL/uL — ABNORMAL LOW (ref 4.40–5.90)
RDW: 14.6 % — ABNORMAL HIGH (ref 11.5–14.5)
WBC: 5.9 10*3/uL (ref 3.8–10.6)

## 2016-04-29 LAB — GLUCOSE, CAPILLARY: Glucose-Capillary: 121 mg/dL — ABNORMAL HIGH (ref 65–99)

## 2016-04-29 LAB — MRSA PCR SCREENING: MRSA by PCR: NEGATIVE

## 2016-04-29 LAB — MAGNESIUM: Magnesium: 1.9 mg/dL (ref 1.7–2.4)

## 2016-04-29 LAB — TROPONIN I
Troponin I: <0.03 ng/mL (ref <0.03)
Troponin I: <0.03 ng/mL (ref <0.03)
Troponin I: <0.03 ng/mL (ref <0.03)

## 2016-04-29 LAB — HEMOGLOBIN A1C: Hgb A1c MFr Bld: 5.6 % (ref 4.0–6.0)

## 2016-04-29 LAB — TSH: TSH: 2.446 u[IU]/mL (ref 0.350–4.500)

## 2016-04-29 MED ORDER — AMIODARONE HCL 200 MG PO TABS
200.0000 mg | ORAL_TABLET | Freq: Every day | ORAL | Status: DC
  Administered 2016-04-29 – 2016-04-30 (×2): 200 mg via ORAL
  Filled 2016-04-29 (×3): qty 1

## 2016-04-29 MED ORDER — ACETAMINOPHEN 325 MG PO TABS
650.0000 mg | ORAL_TABLET | Freq: Four times a day (QID) | ORAL | Status: DC | PRN
  Administered 2016-04-30: 650 mg via ORAL
  Filled 2016-04-29: qty 2

## 2016-04-29 MED ORDER — AMIODARONE HCL IN DEXTROSE 360-4.14 MG/200ML-% IV SOLN
60.0000 mg/h | Freq: Once | INTRAVENOUS | Status: AC
  Administered 2016-04-29: 60 mg/h via INTRAVENOUS
  Filled 2016-04-29 (×3): qty 200

## 2016-04-29 MED ORDER — ACETAMINOPHEN 650 MG RE SUPP: 650.0000 mg | Freq: Four times a day (QID) | RECTAL | Status: DC | PRN

## 2016-04-29 MED ORDER — RANOLAZINE ER 500 MG PO TB12
500.0000 mg | ORAL_TABLET | Freq: Two times a day (BID) | ORAL | Status: DC
  Administered 2016-04-29 – 2016-05-01 (×5): 500 mg via ORAL
  Filled 2016-04-29 (×5): qty 1

## 2016-04-29 MED ORDER — AMIODARONE HCL IN DEXTROSE 360-4.14 MG/200ML-% IV SOLN: 60.0000 mg/h | INTRAVENOUS | Status: DC

## 2016-04-29 MED ORDER — POLYVINYL ALCOHOL 1.4 % OP SOLN
1.0000 [drp] | Freq: Every day | OPHTHALMIC | Status: DC
  Administered 2016-04-29: 1 [drp] via OPHTHALMIC
  Filled 2016-04-29 (×2): qty 15

## 2016-04-29 MED ORDER — FAMOTIDINE 20 MG PO TABS
20.0000 mg | ORAL_TABLET | Freq: Two times a day (BID) | ORAL | Status: DC
Start: 2016-04-29 — End: 2016-05-01
  Administered 2016-04-29 – 2016-05-01 (×5): 20 mg via ORAL
  Filled 2016-04-29 (×5): qty 1

## 2016-04-29 MED ORDER — ACETAMINOPHEN 325 MG PO TABS
650.0000 mg | ORAL_TABLET | Freq: Once | ORAL | Status: AC
  Administered 2016-04-29: 650 mg via ORAL

## 2016-04-29 MED ORDER — ONDANSETRON HCL 4 MG/2ML IJ SOLN: 4.0000 mg | Freq: Four times a day (QID) | INTRAMUSCULAR | Status: DC | PRN

## 2016-04-29 MED ORDER — DEXTROSE 5 % IV SOLN: 60.0000 mg/h | Freq: Once | INTRAVENOUS | Status: DC

## 2016-04-29 MED ORDER — ALBUTEROL SULFATE (2.5 MG/3ML) 0.083% IN NEBU: 2.5000 mg | INHALATION_SOLUTION | Freq: Four times a day (QID) | RESPIRATORY_TRACT | Status: DC | PRN

## 2016-04-29 MED ORDER — DOFETILIDE 500 MCG PO CAPS
500.0000 ug | ORAL_CAPSULE | Freq: Two times a day (BID) | ORAL | Status: DC
  Filled 2016-04-29: qty 1

## 2016-04-29 MED ORDER — CARVEDILOL 12.5 MG PO TABS
12.5000 mg | ORAL_TABLET | Freq: Two times a day (BID) | ORAL | Status: DC
  Administered 2016-04-29 (×2): 12.5 mg via ORAL
  Filled 2016-04-29 (×2): qty 2

## 2016-04-29 MED ORDER — FLUTICASONE PROPIONATE 50 MCG/ACT NA SUSP
1.0000 | Freq: Every day | NASAL | Status: DC
  Administered 2016-04-29: 1 via NASAL
  Filled 2016-04-29 (×2): qty 16

## 2016-04-29 MED ORDER — NITROGLYCERIN 0.4 MG SL SUBL: 0.4000 mg | SUBLINGUAL_TABLET | SUBLINGUAL | Status: DC | PRN

## 2016-04-29 MED ORDER — VITAMIN B-1 100 MG PO TABS
50.0000 mg | ORAL_TABLET | Freq: Every day | ORAL | Status: DC
  Administered 2016-04-29 – 2016-05-01 (×3): 50 mg via ORAL
  Filled 2016-04-29 (×3): qty 1

## 2016-04-29 MED ORDER — AMIODARONE IV BOLUS ONLY 150 MG/100ML
150.0000 mg | Freq: Once | INTRAVENOUS | Status: AC
  Administered 2016-04-29: 150 mg via INTRAVENOUS

## 2016-04-29 MED ORDER — VITAMIN D 1000 UNITS PO TABS
1000.0000 [IU] | ORAL_TABLET | Freq: Every day | ORAL | Status: DC
  Administered 2016-04-29 – 2016-05-01 (×3): 1000 [IU] via ORAL
  Filled 2016-04-29 (×3): qty 1

## 2016-04-29 MED ORDER — SODIUM CHLORIDE 0.9% FLUSH
3.0000 mL | Freq: Two times a day (BID) | INTRAVENOUS | Status: DC
  Administered 2016-04-29 (×2): 3 mL via INTRAVENOUS

## 2016-04-29 MED ORDER — TAMSULOSIN HCL 0.4 MG PO CAPS
0.4000 mg | ORAL_CAPSULE | Freq: Every day | ORAL | Status: DC
  Administered 2016-04-29 – 2016-04-30 (×2): 0.4 mg via ORAL
  Filled 2016-04-29 (×2): qty 1

## 2016-04-29 MED ORDER — ONDANSETRON HCL 4 MG PO TABS: 4.0000 mg | ORAL_TABLET | Freq: Four times a day (QID) | ORAL | Status: DC | PRN

## 2016-04-29 MED ORDER — DONEPEZIL HCL 5 MG PO TABS
10.0000 mg | ORAL_TABLET | Freq: Every day | ORAL | Status: DC
  Administered 2016-04-29 – 2016-04-30 (×2): 10 mg via ORAL
  Filled 2016-04-29 (×2): qty 2

## 2016-04-29 MED ORDER — ALPRAZOLAM 0.5 MG PO TABS: 0.5000 mg | ORAL_TABLET | Freq: Every evening | ORAL | Status: DC | PRN

## 2016-04-29 MED ORDER — PROPYLENE GLYCOL 0.6 % OP SOLN: 1.0000 [drp] | Freq: Every day | OPHTHALMIC | Status: DC

## 2016-04-29 MED ORDER — DOCUSATE SODIUM 100 MG PO CAPS
100.0000 mg | ORAL_CAPSULE | Freq: Two times a day (BID) | ORAL | Status: DC
  Administered 2016-04-29 – 2016-05-01 (×5): 100 mg via ORAL
  Filled 2016-04-29 (×5): qty 1

## 2016-04-29 MED ORDER — DABIGATRAN ETEXILATE MESYLATE 150 MG PO CAPS
150.0000 mg | ORAL_CAPSULE | Freq: Two times a day (BID) | ORAL | Status: DC
  Administered 2016-04-29 – 2016-05-01 (×5): 150 mg via ORAL
  Filled 2016-04-29 (×7): qty 1

## 2016-04-29 MED ORDER — ASPIRIN 81 MG PO CHEW
81.0000 mg | CHEWABLE_TABLET | Freq: Every day | ORAL | Status: DC
  Administered 2016-04-29 – 2016-05-01 (×3): 81 mg via ORAL
  Filled 2016-04-29 (×3): qty 1

## 2016-04-29 NOTE — ED Notes (Signed)
Pt states has felt intermittently "not well" for a week. Pt states he awoke this am at 0100 with chest pain, left sided, feeling near syncopal, diaphoretic. Ems states on their arrival pt was diaphoretic and pale. Pt denies shob, nausea. Pt states has had an intermittent headache over the last week. No known fever.

## 2016-04-29 NOTE — ED Notes (Signed)
Hr down into the 50s, dr. Sheryle Hailiamond notified, amiodarone stopped at this time per md request.

## 2016-04-29 NOTE — ED Notes (Signed)
Pt remains on defib pads, cardiac monitor and crash cart monitoring intact. Pt denies further needs at this time. Pt rates cp 2/10, states pt has chronic 1-2/10 chest pain daily.

## 2016-04-29 NOTE — ED Provider Notes (Signed)
[facility] Emergency Department Provider Note   First MD Initiated Contact with Patient 04/29/16 0302     (approximate)  I have reviewed the triage vital signs and the nursing notes.   HISTORY  Chief Complaint Chest Pain    HPI Keith Estes is a 82 y.o. male with history of coronary artery disease as a status post 5 cardiac stent placement per patient's wife to were placed in 2010 and followed by 3 in 2011. Patient presents to the emergency department with acute onset of chest pain diaphoresis rapid heartbeat 1:00 AM this morning. Patient states that he took 3 sublingual nitroglycerin as well as 3 and 24 mg of aspirin before presentation. She states is current pain score is 2 out of 10. In addition patient admits to intermittent dizziness accompanied by palpitations this week.   Past Medical History:  Diagnosis Date  . COPD (chronic obstructive pulmonary disease) (HCC)   . Coronary artery disease 2012   LHC: LM 30%, mLAD 50%, D2 40%, OM1 70%, LCX 20% ISR, 50% RCA; patient reports no intervention performed    Patient Active Problem List   Diagnosis Date Noted  . Chest pain 04/29/2016    Past Surgical History:  Procedure Laterality Date  . APPENDECTOMY    . CARDIOVASCULAR STRESS TEST  2014   [facility]: NM: Stress nuclear: No evidence of ischemia, EF: 58% - No significant change from 2011  . HERNIA REPAIR      Prior to Admission medications   Medication Sig Start Date End Date Taking? Authorizing Provider  albuterol (PROVENTIL) (2.5 MG/3ML) 0.083% nebulizer solution Take 2.5 mg by nebulization every 6 (six) hours as needed for wheezing or shortness of breath.   Yes Historical Provider, MD  ALPRAZolam Prudy Feeler) 0.5 MG tablet Take 0.5 mg by mouth at bedtime as needed for anxiety.   Yes Historical Provider, MD  aspirin 81 MG tablet Take 81 mg by mouth daily.   Yes Historical Provider, MD  carvedilol (COREG) 12.5 MG tablet Take 12.5 mg by mouth 2 (two)  times daily with a meal.   Yes Historical Provider, MD  Cholecalciferol 10000 units TABS Take by mouth.   Yes Historical Provider, MD  dabigatran (PRADAXA) 150 MG CAPS capsule Take 150 mg by mouth 2 (two) times daily.   Yes Historical Provider, MD  dofetilide (TIKOSYN) 500 MCG capsule Take 500 mcg by mouth 2 (two) times daily.   Yes Historical Provider, MD  donepezil (ARICEPT) 10 MG tablet Take 10 mg by mouth at bedtime.   Yes Historical Provider, MD  fluticasone (FLONASE) 50 MCG/ACT nasal spray Place 1 spray into both nostrils daily.   Yes Historical Provider, MD  nitroGLYCERIN (NITROSTAT) 0.4 MG SL tablet Place 0.4 mg under the tongue every 5 (five) minutes as needed for chest pain.   Yes Historical Provider, MD  Propylene Glycol (SYSTANE BALANCE) 0.6 % SOLN Apply 1 drop to eye daily.    Yes Historical Provider, MD  ranitidine (ZANTAC) 150 MG tablet Take 150 mg by mouth 2 (two) times daily.   Yes Historical Provider, MD  ranolazine (RANEXA) 500 MG 12 hr tablet Take 500 mg by mouth 2 (two) times daily.   Yes Historical Provider, MD  tamsulosin (FLOMAX) 0.4 MG CAPS capsule Take 0.4 mg by mouth.   Yes Historical Provider, MD  thiamine (VITAMIN B-1) 50 MG tablet Take 50 mg by mouth daily.   Yes Historical Provider, MD    Allergies Diltiazem; Hydrocodone; Lipitor [atorvastatin];  Lisinopril; Percodan [oxycodone-aspirin]; and Vicodin [hydrocodone-acetaminophen]  No family history on file.  Social History Social History  Substance Use Topics  . Smoking status: Never Smoker  . Smokeless tobacco: Not on file  . Alcohol use No    Review of Systems Constitutional: No fever/chills Eyes: No visual changes. ENT: No sore throat. Cardiovascular: Denies chest pain. Respiratory: Denies shortness of breath. Gastrointestinal: No abdominal pain.  No nausea, no vomiting.  No diarrhea.  No constipation. Genitourinary: Negative for dysuria. Musculoskeletal: Negative for back pain. Skin: Negative for  rash. Neurological: Negative for headaches, focal weakness or numbness.  10-point ROS otherwise negative.  ____________________________________________   PHYSICAL EXAM:  VITAL SIGNS: ED Triage Vitals  Enc Vitals Group     BP 04/29/16 0245 127/89     Pulse Rate 04/29/16 0249 (!) 104     Resp 04/29/16 0249 16     Temp 04/29/16 0249 97.3 F (36.3 C)     Temp Source 04/29/16 0249 Oral     SpO2 04/29/16 0249 99 %     Weight --      Height 04/29/16 0249 5\' 10"  (1.778 m)     Head Circumference --      Peak Flow --      Pain Score 04/29/16 0310 2     Pain Loc --      Pain Edu? --      Excl. in GC? --     Constitutional: Alert and oriented. Well appearing and in no acute distress. Eyes: Conjunctivae are normal. PERRL. EOMI. Head: Atraumatic. Nose: No congestion/rhinnorhea. Mouth/Throat: Mucous membranes are moist.  Oropharynx non-erythematous. Neck: No stridor.  No meningeal signs.  Cardiovascular: Normal rate, regular rhythm. Good peripheral circulation. Grossly normal heart sounds. Respiratory: Normal respiratory effort.  No retractions. Lungs CTAB. Gastrointestinal: Soft and nontender. No distention.   Musculoskeletal: No lower extremity tenderness nor edema. No gross deformities of extremities. Neurologic:  Normal speech and language. No gross focal neurologic deficits are appreciated.  Skin:  Skin is warm, dry and intact. No rash noted. Psychiatric: Mood and affect are normal. Speech and behavior are normal.  ____________________________________________   LABS (all labs ordered are listed, but only abnormal results are displayed)  Labs Reviewed  BASIC METABOLIC PANEL - Abnormal; Notable for the following:       Result Value   Glucose, Bld 114 (*)    BUN 27 (*)    All other components within normal limits  CBC - Abnormal; Notable for the following:    RBC 4.20 (*)    HCT 39.9 (*)    RDW 14.6 (*)    All other components within normal limits  TROPONIN I    ____________________________________________  EKG  ED ECG REPORT I, [place] N BROWN, the attending physician, personally viewed and interpreted this ECG.   Date: 04/29/2016  EKG Time: 2:47 AM  Rate: 122  Rhythm: Atrial fibrillation with rapid ventricular response, premature ventricular contractions  Axis: Normal  Intervals: Irregular PR interval  ST&T Change: None  ____________________________________________  RADIOLOGY I, Ridgeley N BROWN, personally viewed and evaluated these images (plain radiographs) as part of my medical decision making, as well as reviewing the written report by the radiologist.  No results found.  Procedures   Critical Care performed: CRITICAL CARE Performed by: Darci Current   Total critical care time: 40 minutes  Critical care time was exclusive of separately billable procedures and treating other patients.  Critical care was necessary to treat or prevent  imminent or life-threatening deterioration.  Critical care was time spent personally by me on the following activities: development of treatment plan with patient and/or surrogate as well as nursing, discussions with consultants, evaluation of patient's response to treatment, examination of patient, obtaining history from patient or surrogate, ordering and performing treatments and interventions, ordering and review of laboratory studies, ordering and review of radiographic studies, pulse oximetry and re-evaluation of patient's condition.  ____________________________________________   INITIAL IMPRESSION / ASSESSMENT AND PLAN / ED COURSE  Pertinent labs & imaging results that were available during my care of the patient were reviewed by me and considered in my medical decision making (see chart for details).  During my evaluation of patient patient had multiple runs of ventricular tachycardia as many as 6 beats. As such patient was given amiodarone 150 mg bolus followed by an amiodarone  infusion. At approximately 4:20 AM patient's rate controlled no longer in atrial fibrillation no further runs of ventricular tachycardia noted. Patient discussed with Dr. Sheryle Haildiamond for hospital admission for further evaluation and management.   Clinical Course    ____________________________________________  FINAL CLINICAL IMPRESSION(S) / ED DIAGNOSES  Final diagnoses:  Ventricular tachycardia (HCC)  Chest pain, unspecified chest pain type  Atrial fibrillation with rapid ventricular response (HCC)     MEDICATIONS GIVEN DURING THIS VISIT:  Medications  amiodarone (NEXTERONE) IV bolus only 150 mg/100 mL (0 mg Intravenous Stopped 04/29/16 0311)  amiodarone (NEXTERONE PREMIX) 360-4.14 MG/200ML-% (1.8 mg/mL) IV infusion (60 mg/hr Intravenous New Bag/Given 04/29/16 0310)  acetaminophen (TYLENOL) tablet 650 mg (650 mg Oral Given 04/29/16 0315)     NEW OUTPATIENT MEDICATIONS STARTED DURING THIS VISIT:  New Prescriptions   No medications on file    Modified Medications   No medications on file    Discontinued Medications   AZELASTINE (ASTELIN) 0.1 % NASAL SPRAY    Place into both nostrils 2 (two) times daily. Use in each nostril as directed   MULTIPLE VITAMINS-MINERALS (PRESERVISION AREDS 2 PO)    Take by mouth.   OMEGA-3 FATTY ACIDS (FISH OIL) 500 MG CAPS    Take by mouth.   PRAVASTATIN (PRAVACHOL) 10 MG TABLET    Take 10 mg by mouth daily.     Note:  This document was prepared using Dragon voice recognition software and may include unintentional dictation errors.    Darci Currentandolph N Brown, MD 04/29/16 [id number]

## 2016-04-29 NOTE — ED Notes (Signed)
Pt was placed on defib pads on arrival to ed room. Crash cart at bedside, pads attached to pt.

## 2016-04-29 NOTE — Progress Notes (Addendum)
[employer] [facility] [facility] [place] [facility]   PATIENT NAME: Keith Estes    MR#:  [id number]  DATE OF BIRTH:  05/01/1929  SUBJECTIVE:  CHIEF COMPLAINT:   Chief Complaint  Patient presents with  . Chest Pain   -Admitted with chest pain and wide complex tachycardia associated with atrial fibrillation. -Converted to normal sinus rhythm. Feels much better. Appreciate cardiology consult  REVIEW OF SYSTEMS:  Review of Systems  Constitutional: Positive for malaise/fatigue. Negative for chills and fever.  HENT: Negative for ear discharge and ear pain.   Eyes: Negative for blurred vision and double vision.  Respiratory: Negative for cough, shortness of breath and wheezing.   Cardiovascular: Negative for chest pain, palpitations and leg swelling.  Gastrointestinal: Negative for abdominal pain, constipation, diarrhea, nausea and vomiting.  Genitourinary: Negative for dysuria and urgency.  Musculoskeletal: Negative for myalgias.  Neurological: Negative for dizziness, sensory change, speech change, focal weakness, seizures and headaches.  Psychiatric/Behavioral: Negative for depression.    DRUG ALLERGIES:   Allergies  Allergen Reactions  . Diltiazem Other (See Comments)    Headache, GI upset  . Hydrocodone Nausea And Vomiting  . Lipitor [Atorvastatin] Other (See Comments)    Myalgias  . Lisinopril Cough  . Percodan [Oxycodone-Aspirin] Nausea And Vomiting  . Vicodin [Hydrocodone-Acetaminophen] Nausea And Vomiting    VITALS:  Blood pressure 138/76, pulse 63, temperature 97.1 F (36.2 C), temperature source Oral, resp. rate 12, height 5\' 7"  (1.702 m), weight 88.6 kg (195 lb 5.2 oz), SpO2 97 %.  PHYSICAL EXAMINATION:  Physical Exam  GENERAL:  82 y.o.-year-old patient lying in the bed with no acute distress.  EYES: Pupils equal, round, reactive to light and accommodation. No scleral icterus. Extraocular muscles intact.  HEENT: Head atraumatic, normocephalic.  Oropharynx and nasopharynx clear.  NECK:  Supple, no jugular venous distention. No thyroid enlargement, no tenderness.  LUNGS: Normal breath sounds bilaterally, no wheezing, rales,rhonchi or crepitation. No use of accessory muscles of respiration.  CARDIOVASCULAR: S1, S2 normal. No  rubs, or gallops. 3/6 systolic murmur is present ABDOMEN: Soft, nontender, nondistended. Bowel sounds present. No organomegaly or mass.  EXTREMITIES: No pedal edema, cyanosis, or clubbing.  NEUROLOGIC: Cranial nerves II through XII are intact. Muscle strength 5/5 in all extremities. Sensation intact. Gait not checked.  PSYCHIATRIC: The patient is alert and oriented x 3.  SKIN: No obvious rash, lesion, or ulcer.    LABORATORY PANEL:   CBC  Recent Labs Lab 04/29/16 0302  WBC 5.9  HGB 14.3  HCT 39.9*  PLT 168   ------------------------------------------------------------------------------------------------------------------  Chemistries   Recent Labs Lab 04/29/16 0302  NA 139  K 3.8  CL 107  CO2 24  GLUCOSE 114*  BUN 27*  CREATININE 1.06  CALCIUM 9.1  MG 1.9   ------------------------------------------------------------------------------------------------------------------  Cardiac Enzymes  Recent Labs Lab 04/29/16 0302  TROPONINI <0.03   ------------------------------------------------------------------------------------------------------------------  RADIOLOGY:  No results found.  EKG:   Orders placed or performed during the hospital encounter of 04/29/16  . EKG 12-Lead  . EKG 12-Lead  . ED EKG within 10 minutes  . ED EKG within 10 minutes  . EKG 12-Lead  . EKG 12-Lead    ASSESSMENT AND PLAN:   82 year old male with past medical history significant for paroxysmal atrial fibrillation on tikosyn and Pradaxa, CAD status post prior stents and complex three-vessel disease on medical management, hypertension and mild dementia presents to hospital secondary to chest pain and  diaphoresis.  #1 atrial fibrillation with rapid  wide complex ventricular rate-presenting with angina and dyspnea. -Per his cardiologist at [facility], patient's paroxysmal atrial fibrillation has always been presenting with angina and dyspnea symptoms. -Troponins have remained negative. Continue Pradaxa for anticoagulation. -Off of amiodarone drip. Converted to normal sinus rhythm. -Started on oral amiodarone. Continue Coreg -continue to keep his appt with EP cardiology as outpatient  #2 CAD-troponins negative so far. No indication for cardiac catheterization. -Check echocardiogram. Monitor closely. -On aspirin, Coreg, Ranexa. Not on statin due to intolerance  #3 dementia-stable at baseline. On Aricept  #4 BPH-on Flomax  #5 DVT prophylaxis-on Pradaxa  Physical therapy consult today. Transfer to telemetry floor     All the records are reviewed and case discussed with Care Management/Social Workerr. Management plans discussed with the patient, family and they are in agreement.  CODE STATUS: Full code  TOTAL TIME TAKING CARE OF THIS PATIENT: 37 minutes.   POSSIBLE D/C tomorrow, DEPENDING ON CLINICAL CONDITION.   Enid Baas M.D on 04/29/2016 at 12:11 PM  Between 7am to 6pm - Pager - [phone]  After 6pm go to [email or web address] - Social research officer, government  [facility]  Office  [phone]  CC: Primary care physician; No PCP Per Patient

## 2016-04-29 NOTE — ED Triage Notes (Signed)
Pt states onset of chest pain with diaphoresis, near syncope, arm pain this am at 0100. Pt states he took ntg x3 and asa 324mg . Pt states pain is improved after ntg to 2/10. Dr. Manson PasseyBrown at bedside.

## 2016-04-29 NOTE — Progress Notes (Signed)
Pt's QTc= 0.42

## 2016-04-29 NOTE — Consult Note (Signed)
[facility] Cardiology Consultation Note  Patient ID: Keith Estes, MRN: [id number], DOB/AGE: 80-Jan-1930 80 y.o. Admit date: 04/29/2016   Date of Consult: 04/29/2016 Primary Physician: No PCP Per Patient Primary Cardiologist: Heber Donora  Chief Complaint:  Chief Complaint  Patient presents with  . Chest Pain   Reason for Consult: shortness of breath with chest pain  HPI: 82 y.o. male with known coronary artery disease status post multiple vessel coronary atherosclerosis treated with medication management rather than further intervention but has had a remote stent placed has come in with significant smothering in the middle the night shortness of breath and left upper chest discomfort with palpitations. When the patient was seen he had episodes of atrial fibrillation paroxysmal nonvalvular in nature with some rapid ventricular rate and bundle branch block. The patient has had previous treatment with Tikosyn for maintenance of normal sinus rhythm although currently he has hypokalemia potential he causing side effects. Magnesium level is 1.9 and creatinine is 1.0 at this time the patient appears to have full resolution of his chest pain with normal troponin and no current evidence of myocardial infarction. Oxygenation has been appropriate despite the feeling of smothering with CPAP machine earlier in the evening. The patient was placed on amiodarone and had good conversion to normal sinus rhythm  Past Medical History:  Diagnosis Date  . COPD (chronic obstructive pulmonary disease) (HCC)   . Coronary artery disease 2012   LHC: LM 30%, mLAD 50%, D2 40%, OM1 70%, LCX 20% ISR, 50% RCA; patient reports no intervention performed      Surgical History:  Past Surgical History:  Procedure Laterality Date  . APPENDECTOMY    . CARDIOVASCULAR STRESS TEST  2014   [facility]: NM: Stress nuclear: No evidence of ischemia, EF: 58% - No significant change from 2011  . HERNIA REPAIR       Home  Meds: Prior to Admission medications   Medication Sig Start Date End Date Taking? Authorizing Provider  albuterol (PROVENTIL) (2.5 MG/3ML) 0.083% nebulizer solution Take 2.5 mg by nebulization every 6 (six) hours as needed for wheezing or shortness of breath.   Yes Historical Provider, MD  ALPRAZolam Prudy Feeler) 0.5 MG tablet Take 0.5 mg by mouth at bedtime as needed for anxiety.   Yes Historical Provider, MD  aspirin 81 MG tablet Take 81 mg by mouth daily.   Yes Historical Provider, MD  carvedilol (COREG) 12.5 MG tablet Take 12.5 mg by mouth 2 (two) times daily with a meal.   Yes Historical Provider, MD  Cholecalciferol 10000 units TABS Take by mouth.   Yes Historical Provider, MD  dabigatran (PRADAXA) 150 MG CAPS capsule Take 150 mg by mouth 2 (two) times daily.   Yes Historical Provider, MD  dofetilide (TIKOSYN) 500 MCG capsule Take 500 mcg by mouth 2 (two) times daily.   Yes Historical Provider, MD  donepezil (ARICEPT) 10 MG tablet Take 10 mg by mouth at bedtime.   Yes Historical Provider, MD  fluticasone (FLONASE) 50 MCG/ACT nasal spray Place 1 spray into both nostrils daily.   Yes Historical Provider, MD  nitroGLYCERIN (NITROSTAT) 0.4 MG SL tablet Place 0.4 mg under the tongue every 5 (five) minutes as needed for chest pain.   Yes Historical Provider, MD  Propylene Glycol (SYSTANE BALANCE) 0.6 % SOLN Apply 1 drop to eye daily.    Yes Historical Provider, MD  ranitidine (ZANTAC) 150 MG tablet Take 150 mg by mouth 2 (two) times daily.   Yes Historical Provider,  MD  ranolazine (RANEXA) 500 MG 12 hr tablet Take 500 mg by mouth 2 (two) times daily.   Yes Historical Provider, MD  tamsulosin (FLOMAX) 0.4 MG CAPS capsule Take 0.4 mg by mouth.   Yes Historical Provider, MD  thiamine (VITAMIN B-1) 50 MG tablet Take 50 mg by mouth daily.   Yes Historical Provider, MD    Inpatient Medications:  . aspirin  81 mg Oral Daily  . carvedilol  12.5 mg Oral BID WC  . cholecalciferol  1,000 Units Oral Daily  .  dabigatran  150 mg Oral BID  . docusate sodium  100 mg Oral BID  . dofetilide  500 mcg Oral BID  . donepezil  10 mg Oral QHS  . famotidine  20 mg Oral BID  . fluticasone  1 spray Each Nare Daily  . polyvinyl alcohol  1 drop Both Eyes Daily  . ranolazine  500 mg Oral BID  . sodium chloride flush  3 mL Intravenous Q12H  . tamsulosin  0.4 mg Oral QPC supper  . thiamine  50 mg Oral Daily   . amiodarone Stopped (04/29/16 [id number])    Allergies:  Allergies  Allergen Reactions  . Diltiazem Other (See Comments)    Headache, GI upset  . Hydrocodone Nausea And Vomiting  . Lipitor [Atorvastatin] Other (See Comments)    Myalgias  . Lisinopril Cough  . Percodan [Oxycodone-Aspirin] Nausea And Vomiting  . Vicodin [Hydrocodone-Acetaminophen] Nausea And Vomiting    Social History   Social History  . Marital status: Married    Spouse name: N/A  . Number of children: N/A  . Years of education: N/A   Occupational History  . Not on file.   Social History Main Topics  . Smoking status: Never Smoker  . Smokeless tobacco: Not on file  . Alcohol use No  . Drug use: Unknown  . Sexual activity: Not on file   Other Topics Concern  . Not on file   Social History Narrative  . No narrative on file     Family History  Problem Relation Age of Onset  . Heart disease Brother      Review of Systems Positive for Chest pain smothering Negative for: General:  chills, fever, night sweats or weight changes.  Cardiovascular: PND orthopnea syncope dizziness  Dermatological skin lesions rashes Respiratory: Cough congestion Urologic: Frequent urination urination at night and hematuria Abdominal: negative for nausea, vomiting, diarrhea, bright red blood per rectum, melena, or hematemesis Neurologic: negative for visual changes, and/or hearing changes  All other systems reviewed and are otherwise negative except as noted above.  Labs:  Recent Labs  04/29/16 0302  TROPONINI <0.03   Lab  Results  Component Value Date   WBC 5.9 04/29/2016   HGB 14.3 04/29/2016   HCT 39.9 (L) 04/29/2016   MCV 95.1 04/29/2016   PLT 168 04/29/2016    Recent Labs Lab 04/29/16 0302  NA 139  K 3.8  CL 107  CO2 24  BUN 27*  CREATININE 1.06  CALCIUM 9.1  GLUCOSE 114*   No results found for: CHOL, HDL, LDLCALC, TRIG No results found for: DDIMER  Radiology/Studies:  No results found.  EKG: Normal sinus rhythm with nonspecific ST changes  Weights: Filed Weights   04/29/16 0600  Weight: 195 lb 5.2 oz (88.6 kg)     Physical Exam: Blood pressure 128/81, pulse 62, temperature 98.4 F (36.9 C), temperature source Oral, resp. rate 15, height 5\' 7"  (1.702 m), weight 195 lb  5.2 oz (88.6 kg), SpO2 100 %. Body mass index is 30.59 kg/m. General: Well developed, well nourished, in no acute distress. Head eyes ears nose throat: Normocephalic, atraumatic, sclera non-icteric, no xanthomas, nares are without discharge. No apparent thyromegaly and/or mass  Lungs: Normal respiratory effort.  no wheezes, no rales, no rhonchi.  Heart: RRR with normal S1 S2. no murmur gallop, no rub, PMI is normal size and placement, carotid upstroke normal without bruit, jugular venous pressure is normal Abdomen: Soft, non-tender, non-distended with normoactive bowel sounds. No hepatomegaly. No rebound/guarding. No obvious abdominal masses. Abdominal aorta is normal size without bruit Extremities: No edema. no cyanosis, no clubbing, no ulcers  Peripheral : 2+ bilateral upper extremity pulses, 2+ bilateral femoral pulses, 2+ bilateral dorsal pedal pulse Neuro: Alert and oriented. No facial asymmetry. No focal deficit. Moves all extremities spontaneously. Musculoskeletal: Normal muscle tone without kyphosis Psych:  Responds to questions appropriately with a normal affect.    Assessment: 82 year old male with known coronary artery disease status post remote stent placement with diffuse three-vessel disease on Ranexa  for intermittent anginal equivalent as well as appropriate medication management for atrial fibrillation nonvalvular with rapid ventricular rate now with wide complex from rapid rate placed on amiodarone with resolution and maintenance of normal sinus rhythm. No evidence of myocardial infarction  Plan: 1. Change amiodarone orally at 200 mg daily for maintenance of normal sinus rhythm 2. Abstain from Tikosyn at this time due to hypokalemia and concerns of proarrhythmia 3. Predaxa for risk reduction in stroke with atrial fibrillation 4. Echocardiogram for LV systolic dysfunction valvular heart disease contributing to above 5. Continue serial ECG and enzymes to assess for possible myocardial infarction 6. Further diagnostic testing and treatment options after above  Signed, Lamar Blinks M.D. [facility] Cardiology 04/29/2016, 9:07 AM

## 2016-04-29 NOTE — H&P (Signed)
Keith Estes is an 82 y.o. male.   Chief Complaint: Chest pain HPI: The patient with past medical history of coronary artery disease status post stent placement presents to the emergency department complaining of chest pain. He was awakened from sleep with significant diaphoresis. At that time he could not catch his breath and thought there may be something wrong with his CPAP machine. He also felt some aching chest pain in his lower left chest. It did not radiate. He denies nausea at this moment but his wife reminded him that he had not been feeling well all week and had complained of some nausea as well as dizziness. In the emergency department the patient continued to have chest pain despite aspirin and nitroglycerin. He was found to have atrial fibrillation with rapid ventricular rate with runs of nonsustained ventricular tachycardia. The patient was started on amiodarone and the hospitalist service was called for admission.  Past Medical History:  Diagnosis Date  . COPD (chronic obstructive pulmonary disease) ([place])   . Coronary artery disease 2012   LHC: LM 30%, mLAD 50%, D2 40%, OM1 70%, LCX 20% ISR, 50% RCA; patient reports no intervention performed    Past Surgical History:  Procedure Laterality Date  . APPENDECTOMY    . CARDIOVASCULAR STRESS TEST  2014   [facility]: NM: Stress nuclear: No evidence of ischemia, EF: 58% - No significant change from 2011  . HERNIA REPAIR      Family History  Problem Relation Age of Onset  . Heart disease Brother    Social History:  reports that he has never smoked. He does not have any smokeless tobacco history on file. He reports that he does not drink alcohol. His drug history is not on file.  Allergies:  Allergies  Allergen Reactions  . Diltiazem Other (See Comments)    Headache, GI upset  . Hydrocodone Nausea And Vomiting  . Lipitor [Atorvastatin] Other (See Comments)    Myalgias  . Lisinopril Cough  . Percodan [Oxycodone-Aspirin] Nausea  And Vomiting  . Vicodin [Hydrocodone-Acetaminophen] Nausea And Vomiting    Prior to Admission medications   Medication Sig Start Date End Date Taking? Authorizing Provider  albuterol (PROVENTIL) (2.5 MG/3ML) 0.083% nebulizer solution Take 2.5 mg by nebulization every 6 (six) hours as needed for wheezing or shortness of breath.   Yes Historical Provider, MD  ALPRAZolam Duanne Moron) 0.5 MG tablet Take 0.5 mg by mouth at bedtime as needed for anxiety.   Yes Historical Provider, MD  aspirin 81 MG tablet Take 81 mg by mouth daily.   Yes Historical Provider, MD  carvedilol (COREG) 12.5 MG tablet Take 12.5 mg by mouth 2 (two) times daily with a meal.   Yes Historical Provider, MD  Cholecalciferol 10000 units TABS Take by mouth.   Yes Historical Provider, MD  dabigatran (PRADAXA) 150 MG CAPS capsule Take 150 mg by mouth 2 (two) times daily.   Yes Historical Provider, MD  dofetilide (TIKOSYN) 500 MCG capsule Take 500 mcg by mouth 2 (two) times daily.   Yes Historical Provider, MD  donepezil (ARICEPT) 10 MG tablet Take 10 mg by mouth at bedtime.   Yes Historical Provider, MD  fluticasone (FLONASE) 50 MCG/ACT nasal spray Place 1 spray into both nostrils daily.   Yes Historical Provider, MD  nitroGLYCERIN (NITROSTAT) 0.4 MG SL tablet Place 0.4 mg under the tongue every 5 (five) minutes as needed for chest pain.   Yes Historical Provider, MD  Propylene Glycol (SYSTANE BALANCE) 0.6 % SOLN  Apply 1 drop to eye daily.    Yes Historical Provider, MD  ranitidine (ZANTAC) 150 MG tablet Take 150 mg by mouth 2 (two) times daily.   Yes Historical Provider, MD  ranolazine (RANEXA) 500 MG 12 hr tablet Take 500 mg by mouth 2 (two) times daily.   Yes Historical Provider, MD  tamsulosin (FLOMAX) 0.4 MG CAPS capsule Take 0.4 mg by mouth.   Yes Historical Provider, MD  thiamine (VITAMIN B-1) 50 MG tablet Take 50 mg by mouth daily.   Yes Historical Provider, MD     Results for orders placed or performed during the hospital  encounter of 04/29/16 (from the past 48 hour(s))  Basic metabolic panel     Status: Abnormal   Collection Time: 04/29/16  3:02 AM  Result Value Ref Range   Sodium 139 135 - 145 mmol/L   Potassium 3.8 3.5 - 5.1 mmol/L   Chloride 107 101 - 111 mmol/L   CO2 24 22 - 32 mmol/L   Glucose, Bld 114 (H) 65 - 99 mg/dL   BUN 27 (H) 6 - 20 mg/dL   Creatinine, Ser 1.06 0.61 - 1.24 mg/dL   Calcium 9.1 8.9 - 10.3 mg/dL   GFR calc non Af Amer >60 >60 mL/min   GFR calc Af Amer >60 >60 mL/min    Comment: (NOTE) The eGFR has been calculated using the CKD EPI equation. This calculation has not been validated in all clinical situations. eGFR's persistently <60 mL/min signify possible Chronic Kidney Disease.    Anion gap 8 5 - 15  CBC     Status: Abnormal   Collection Time: 04/29/16  3:02 AM  Result Value Ref Range   WBC 5.9 3.8 - 10.6 K/uL   RBC 4.20 (L) 4.40 - 5.90 MIL/uL   Hemoglobin 14.3 13.0 - 18.0 g/dL   HCT 39.9 (L) 40.0 - 52.0 %   MCV 95.1 80.0 - 100.0 fL   MCH 33.9 26.0 - 34.0 pg   MCHC 35.7 32.0 - 36.0 g/dL   RDW 14.6 (H) 11.5 - 14.5 %   Platelets 168 150 - 440 K/uL  Troponin I     Status: None   Collection Time: 04/29/16  3:02 AM  Result Value Ref Range   Troponin I <0.03 <0.03 ng/mL  Magnesium     Status: None   Collection Time: 04/29/16  3:02 AM  Result Value Ref Range   Magnesium 1.9 1.7 - 2.4 mg/dL   No results found.  Review of Systems  Constitutional: Positive for diaphoresis. Negative for chills and fever.  HENT: Negative for sore throat and tinnitus.   Eyes: Negative for blurred vision and redness.  Respiratory: Positive for shortness of breath. Negative for cough.   Cardiovascular: Positive for chest pain. Negative for palpitations, orthopnea and PND.  Gastrointestinal: Negative for abdominal pain, diarrhea, nausea and vomiting.  Genitourinary: Negative for dysuria, frequency and urgency.  Musculoskeletal: Negative for joint pain and myalgias.  Skin: Negative for  rash.       No lesions  Neurological: Negative for speech change, focal weakness and weakness.  Endo/Heme/Allergies: Does not bruise/bleed easily.       No temperature intolerance  Psychiatric/Behavioral: Negative for depression and suicidal ideas.    Blood pressure 100/76, pulse 64, temperature 97.3 F (36.3 C), temperature source Oral, resp. rate 11, height 5' 10"  (1.778 m), SpO2 93 %. Physical Exam   Assessment/Plan This is an 82 year old male admitted for ongoing chest pain and nonsustained ventricular  tachycardia secondary to atrial fibrillation with rapid ventricular rate. 1. Chest pain: Likely demand ischemia secondary to persistent tachycardia. Goal is to decrease heart rate in order to limit myocardial oxygen demand. Follow troponin and monitor telemetry. Nitroglycerin and morphine as needed for pain. Cardiology consulted for possible heart catheterization. 2. Atrial fibrillation: Rapid ventricular rate improving. Continue amiodarone drip. Dofetilide and carvedilol per home regimen. Continue Pradaxa. 3. Ventricular tachycardia: Nonsustained. The fibrillator pads in place. The patient is stable. 4. Coronary artery disease: Last nuclear stress 2014 unchanged from 2011. Last heart cath 2012 at [facility]. Lesions described above. Continue aspirin and Ranexa. 5. Obstructive sleep apnea: BiPAP daily at bedtime 6. BPH: Continue Flomax 7. Mild cognitive impairment: Continue Aricept  8. DVT prophylaxis: Full anticoagulation as above 9. GI prophylaxis: Pepcid per home regimen The patient is a full code. Time spent on admission was inpatient care possibly 45 minutes  Harrie Foreman, MD 04/29/2016, 5:21 AM

## 2016-04-29 NOTE — ED Notes (Signed)
Pt updated on admission process. Pt verbalizes understanding.  

## 2016-04-30 ENCOUNTER — Inpatient Hospital Stay
Admit: 2016-04-30 | Discharge: 2016-04-30 | Disposition: A | Discharge status: Home / Self Care | Payer: Medicare Other | Attending: Internal Medicine | Admitting: Internal Medicine

## 2016-04-30 LAB — BASIC METABOLIC PANEL
Anion gap: 6 (ref 5–15)
BUN: 21 mg/dL — AB (ref 6–20)
CALCIUM: 9.1 mg/dL (ref 8.9–10.3)
CO2: 27 mmol/L (ref 22–32)
CREATININE: 0.91 mg/dL (ref 0.61–1.24)
Chloride: 106 mmol/L (ref 101–111)
GFR calc Af Amer: >60 mL/min (ref >60)
GLUCOSE: 104 mg/dL — AB (ref 65–99)
POTASSIUM: 4 mmol/L (ref 3.5–5.1)
Sodium: 139 mmol/L (ref 135–145)

## 2016-04-30 LAB — TROPONIN I: Troponin I: <0.03 ng/mL (ref <0.03)

## 2016-04-30 LAB — MAGNESIUM: Magnesium: 1.9 mg/dL (ref 1.7–2.4)

## 2016-04-30 LAB — ECHOCARDIOGRAM COMPLETE
HEIGHTINCHES: 67 in
WEIGHTICAEL: 3075 [oz_av]

## 2016-04-30 MED ORDER — AMIODARONE HCL 200 MG PO TABS
400.0000 mg | ORAL_TABLET | Freq: Every day | ORAL | Status: DC
  Administered 2016-05-01: 400 mg via ORAL
  Filled 2016-04-30: qty 2

## 2016-04-30 MED ORDER — CARVEDILOL 6.25 MG PO TABS
18.7500 mg | ORAL_TABLET | Freq: Two times a day (BID) | ORAL | Status: DC
  Administered 2016-04-30 – 2016-05-01 (×3): 18.75 mg via ORAL
  Filled 2016-04-30 (×3): qty 1

## 2016-04-30 MED ORDER — AMIODARONE HCL 200 MG PO TABS
200.0000 mg | ORAL_TABLET | Freq: Once | ORAL | Status: AC
  Administered 2016-04-30: 200 mg via ORAL

## 2016-04-30 MED ORDER — AMIODARONE HCL 200 MG PO TABS: 200.0000 mg | ORAL_TABLET | Freq: Once | ORAL | Status: DC

## 2016-04-30 NOTE — Progress Notes (Signed)
Pt. Has converted back to A-fib with HR running in 90's low 100's. Pt is not in any acute distress, SOB or pain. Pt. Resting with Bi-pap in place. Will continue to monitor pt. MD paged, awaiting call back.

## 2016-04-30 NOTE — Progress Notes (Signed)
[facility] - [place] at [facility]   PATIENT NAME: Keith Estes    MR#:  [id number]  DATE OF BIRTH:  04/09/1929  SUBJECTIVE:  CHIEF COMPLAINT:   Chief Complaint  Patient presents with  . Chest Pain   -Patient converted back to atrial fibrillation early this morning. Though rate is controlled at rest, he complains of angina and feeling sick. -According to his cardiologist as outpatient, patient is very symptomatic when his rhythm is in atrial fibrillation. -On ambulation, rate has increased.  REVIEW OF SYSTEMS:  Review of Systems  Constitutional: Positive for malaise/fatigue. Negative for chills and fever.  HENT: Negative for ear discharge and ear pain.   Eyes: Negative for blurred vision and double vision.  Respiratory: Negative for cough, shortness of breath and wheezing.   Cardiovascular: Positive for chest pain. Negative for palpitations and leg swelling.  Gastrointestinal: Positive for nausea. Negative for abdominal pain, constipation, diarrhea and vomiting.  Genitourinary: Negative for dysuria and urgency.  Musculoskeletal: Negative for myalgias.  Neurological: Positive for weakness. Negative for dizziness, sensory change, speech change, focal weakness, seizures and headaches.  Psychiatric/Behavioral: Negative for depression.    DRUG ALLERGIES:   Allergies  Allergen Reactions  . Diltiazem Other (See Comments)    Headache, GI upset  . Hydrocodone Nausea And Vomiting  . Lipitor [Atorvastatin] Other (See Comments)    Myalgias  . Lisinopril Cough  . Percodan [Oxycodone-Aspirin] Nausea And Vomiting  . Vicodin [Hydrocodone-Acetaminophen] Nausea And Vomiting    VITALS:  Blood pressure 113/68, pulse 62, temperature 98.4 F (36.9 C), temperature source Oral, resp. rate 19, height 5\' 7"  (1.702 m), weight 87.2 kg (192 lb 3 oz), SpO2 93 %.  PHYSICAL EXAMINATION:  Physical Exam  GENERAL:  82 y.o.-year-old patient lying in the bed with no acute  distress.  EYES: Pupils equal, round, reactive to light and accommodation. No scleral icterus. Extraocular muscles intact.  HEENT: Head atraumatic, normocephalic. Oropharynx and nasopharynx clear.  NECK:  Supple, no jugular venous distention. No thyroid enlargement, no tenderness.  LUNGS: Normal breath sounds bilaterally, no wheezing, rales,rhonchi or crepitation. No use of accessory muscles of respiration.  CARDIOVASCULAR: S1, S2 normal. No  rubs, or gallops. 3/6 systolic murmur is present ABDOMEN: Soft, nontender, nondistended. Bowel sounds present. No organomegaly or mass.  EXTREMITIES: No pedal edema, cyanosis, or clubbing.  NEUROLOGIC: Cranial nerves II through XII are intact. Muscle strength 5/5 in all extremities. Sensation intact. Gait not checked.  PSYCHIATRIC: The patient is alert and oriented x 3.  SKIN: No obvious rash, lesion, or ulcer.    LABORATORY PANEL:   CBC  Recent Labs Lab 04/29/16 0302  WBC 5.9  HGB 14.3  HCT 39.9*  PLT 168   ------------------------------------------------------------------------------------------------------------------  Chemistries   Recent Labs Lab 04/30/16 0027  NA 139  K 4.0  CL 106  CO2 27  GLUCOSE 104*  BUN 21*  CREATININE 0.91  CALCIUM 9.1  MG 1.9   ------------------------------------------------------------------------------------------------------------------  Cardiac Enzymes  Recent Labs Lab 04/30/16 0027  TROPONINI <0.03   ------------------------------------------------------------------------------------------------------------------  RADIOLOGY:  No results found.  EKG:   Orders placed or performed during the hospital encounter of 04/29/16  . EKG 12-Lead  . EKG 12-Lead  . ED EKG within 10 minutes  . ED EKG within 10 minutes  . EKG 12-Lead  . EKG 12-Lead  . EKG 12-Lead  . EKG 12-Lead    ASSESSMENT AND PLAN:   82 year old male with past medical history significant for paroxysmal atrial  fibrillation on tikosyn and Pradaxa, CAD status post prior stents and complex three-vessel disease on medical management, hypertension and mild dementia presents to hospital secondary to chest pain and diaphoresis.  #1 atrial fibrillation with rapid wide complex ventricular rate-presenting with angina and dyspnea. -Per his cardiologist at [facility], patient's paroxysmal atrial fibrillation has always been presenting with angina and dyspnea symptoms. -Converted to normal sinus rhythm on IV amiodarone yesterday. Converted back to normal sinus rhythm on low-dose amiodarone orally today. We'll change amiodarone to 400 daily. Discussed with cardiologist here whether to call EP consult in the hospital, and he has deferred it at this time. -Troponins have remained negative. Continue Pradaxa for anticoagulation. - Continue Coreg -continue to keep his appt with EP cardiology as outpatient in 4 days  #2 CAD-troponins negative so far. No indication for cardiac catheterization. -Follow-up echocardiogram. Monitor closely. -On aspirin, Coreg, Ranexa. Not on statin due to intolerance  #3 dementia-stable at baseline. On Aricept  #4 BPH-on Flomax  #5 DVT prophylaxis-on Pradaxa  Physical therapy consulted. If converts back to normal sinus rhythm and remained stable, anticipate discharge tomorrow     All the records are reviewed and case discussed with Care Management/Social Workerr. Management plans discussed with the patient, family and they are in agreement.  CODE STATUS: Full code  TOTAL TIME TAKING CARE OF THIS PATIENT: 37 minutes.   POSSIBLE D/C tomorrow, DEPENDING ON CLINICAL CONDITION.   Enid Baas M.D on 04/30/2016 at 5:49 PM  Between 7am to 6pm - Pager - [phone]  After 6pm go to [email or web address] - Social research officer, government  [facility]  Office  [phone]  CC: Primary care physician; No PCP Per Patient

## 2016-04-30 NOTE — Progress Notes (Signed)
Pt. Ambulated out into hall way to nurses station and heart rate began to increase into 120's. Pt was returned to bed and  heart rate decreased to high 80's -90's. Pt. Signs or c/o SOB or pain noted. Will continue to monitor.

## 2016-04-30 NOTE — Progress Notes (Signed)
*  PRELIMINARY RESULTS* Echocardiogram 2D Echocardiogram has been performed.  Cristela BlueHege, Shelisa Fern 04/30/2016, 9:10 AM

## 2016-04-30 NOTE — Evaluation (Signed)
Physical Therapy Evaluation Patient Details Name: Keith Estes MRN: [id number] DOB: 07/24/1929 Today's Date: 04/30/2016   History of Present Illness  Pt. presented to ED with chest pain following awakening in the night with diaphoresis, he was found to have Atrial fibrilation. Negative for MI, continuing to be administered medication to control Atrial fibrilation   Clinical Impression  Pt. Awake, alert and oriented upon arrival. Pt. Administered amiodarone just prior to treatment session; pt. Resting HR fluctuating but 80-90's prior to activity. Pt. Tolerated activity well, did demonstrate fluctuations in HR but remained <124bpm denied chest pain and SOB throughout session. Testing showed gross and symmetrical 5/5 UE and LE strength as well as functional strength WNL. Able to perform independent bed mobility, sit<>stand transfer, and ambulation with CGA for safety. Pt. Demonstrates stable sitting and standing balance without UE support. Pt. Demonstrated improved gait speed without RW with symmetrical step through pattern and no LOB or buckling throughout session (11180ft) No PT follow up necessary, will keep pt. On PT caseload throughout the duration of his stay in the hospital but anticipate no need for PT services upon D/C.   Follow Up Recommendations No PT follow up    Equipment Recommendations  Rolling walker with 5" wheels    Recommendations for Other Services       Precautions / Restrictions Precautions Precautions: Fall Restrictions Weight Bearing Restrictions: No      Mobility  Bed Mobility Overal bed mobility: Modified Independent             General bed mobility comments: HOB elevated   Transfers Overall transfer level: Needs assistance Equipment used: Rolling walker (2 wheeled) Transfers: Sit to/from Stand Sit to Stand: Min guard            Ambulation/Gait Ambulation/Gait assistance: Min guard Ambulation Distance (Feet): 180 Feet Assistive device:  Rolling walker (2 wheeled) Gait Pattern/deviations: WFL(Within Functional Limits)   Gait velocity interpretation: at or above normal speed for age/gender General Gait Details: Pt. demonstrates increased cadence without use of RW. equal step through pattern no LOB or buckling noted.   Stairs Stairs: Yes (Deferred today due to Atrial fibrilation )          Wheelchair Mobility    Modified Rankin (Stroke Patients Only)       Balance Overall balance assessment: Independent                                           Pertinent Vitals/Pain Pain Assessment: No/denies pain    Home Living Family/patient expects to be discharged to:: Private residence Living Arrangements: Spouse/significant other;Other relatives (Pt. and his wife live in the basement of his daughters home) Available Help at Discharge: Family Type of Home: House Home Access: Stairs to enter Entrance Stairs-Rails: Can reach both Entrance Stairs-Number of Steps: 10 Home Layout: Two level Home Equipment: Walker - 2 wheels Additional Comments: Pt. states he uses the RW only at night when he gets up to use the restroom     Prior Function Level of Independence: Independent               Hand Dominance   Dominant Hand: Right    Extremity/Trunk Assessment   Upper Extremity Assessment:  (UE strength grossly 5/5 symmetrical)           Lower Extremity Assessment:  (LE strength grossly 5/5 symmetrical )  Communication   Communication: No difficulties  Cognition Arousal/Alertness: Awake/alert Behavior During Therapy: WFL for tasks assessed/performed Overall Cognitive Status: Within Functional Limits for tasks assessed                      General Comments      Exercises Other Exercises Other Exercises: Pt. performed STSx4 and marching in place x10 prior to ambulation to assess tolerance to activity, CGA for safety Other Exercises: High knee marching in place with B  UE support on RW x20 CGA to simulate abiltiy to ascend/descend stairs. Pt. demonstrated endurance of task no LOB or buckling indicating likely intact functional strength/endurance to complete task of stairs      Assessment/Plan    PT Assessment Patent does not need any further PT services  PT Diagnosis     PT Problem List    PT Treatment Interventions     PT Goals (Current goals can be found in the Care Plan section) Acute Rehab PT Goals Patient Stated Goal: Pt would like to return home to normal acitvity  PT Goal Formulation: With patient/family Time For Goal Achievement: 05/14/16 Potential to Achieve Goals: Good    Frequency     Barriers to discharge        Co-evaluation               End of Session Equipment Utilized During Treatment: Gait belt Activity Tolerance: Patient tolerated treatment well (Pt. demonstrated fluctuation of HR with activity but no sustained HR >124) Patient left: with nursing/sitter in room (Pt. in bathroom, aide notified and present upon leaving)           Time: 9604-5409 PT Time Calculation (min) (ACUTE ONLY): 27 min   Charges:   PT Evaluation $PT Eval Moderate Complexity: 1 Procedure PT Treatments $Therapeutic Activity: 8-22 mins   PT G Codes:       Kathan Kirker, SPT  04/30/16,12:15 PM

## 2016-04-30 NOTE — Progress Notes (Signed)
Dr. Sheryle Hailiamond gave order to give morning amiodarone early for A-fib conversion.

## 2016-04-30 NOTE — Care Management (Signed)
No physical therapy needs.  No discharge needs have been identified by care team

## 2016-04-30 NOTE — Progress Notes (Signed)
Pt. Transferred from ICU to room 237. Pt. A&O. Tele applied, verified by Marchelle FolksAmanda, RN. Skin is warm and dry, verified with Lexi, RN. General room orientation given. Instruction on how to use call bell and ascom system. No signs or c/o SOB or acute distress noted. C/o headache, medication given. Will continue to monitor pt.

## 2016-04-30 NOTE — Progress Notes (Signed)
[facility] Cardiology [facility] Encounter Note  Patient: Keith Estes / Admit Date: 04/29/2016 / Date of Encounter: 04/30/2016, 5:33 PM   Subjective: Patient feeling better   Patient ambulating without significant symptoms although some periods of rapid heart rate. Yesterday Patient remains in normal sinus rhythm at this time on appropriate medication management. No evidence of myocardial infarction with normal troponin Patient tolerating amiodarone well at higher dose and remaining in normal rhythm  Review of Systems: Positive for: Shortness of breath Negative for: Vision change, hearing change, syncope, dizziness, nausea, vomiting,diarrhea, bloody stool, stomach pain, cough, congestion, diaphoresis, urinary frequency, urinary pain,skin lesions, skin rashes Others previously listed  Objective: Telemetry: Normal sinus rhythm Physical Exam: Blood pressure 113/68, pulse 62, temperature 98.4 F (36.9 C), temperature source Oral, resp. rate 19, height 5\' 7"  (1.702 m), weight 192 lb 3 oz (87.2 kg), SpO2 93 %. Body mass index is 30.1 kg/m. General: Well developed, well nourished, in no acute distress. Head: Normocephalic, atraumatic, sclera non-icteric, no xanthomas, nares are without discharge. Neck: No apparent masses Lungs: Normal respirations with no wheezes, no rhonchi, no rales , no crackles   Heart: Regular rate and rhythm, normal S1 S2, no murmur, no rub, no gallop, PMI is normal size and placement, carotid upstroke normal without bruit, jugular venous pressure normal Abdomen: Soft, non-tender, non-distended with normoactive bowel sounds. No hepatosplenomegaly. Abdominal aorta is normal size without bruit Extremities: No edema, no clubbing, no cyanosis, no ulcers,  Peripheral: 2+ radial, 2+ femoral, 2+ dorsal pedal pulses Neuro: Alert and oriented. Moves all extremities spontaneously. Psych:  Responds to questions appropriately with a normal affect.   Intake/Output Summary  (Last 24 hours) at 04/30/16 1733 Last data filed at 04/30/16 1608  Gross per 24 hour  Intake              480 ml  Output             2300 ml  Net            -1820 ml    Inpatient Medications:  . [START ON 05/01/2016] amiodarone  400 mg Oral Daily  . aspirin  81 mg Oral Daily  . carvedilol  18.75 mg Oral BID WC  . cholecalciferol  1,000 Units Oral Daily  . dabigatran  150 mg Oral BID  . docusate sodium  100 mg Oral BID  . donepezil  10 mg Oral QHS  . famotidine  20 mg Oral BID  . fluticasone  1 spray Each Nare Daily  . polyvinyl alcohol  1 drop Both Eyes Daily  . ranolazine  500 mg Oral BID  . sodium chloride flush  3 mL Intravenous Q12H  . tamsulosin  0.4 mg Oral QPC supper  . thiamine  50 mg Oral Daily   Infusions:    Labs:  Recent Labs  04/29/16 0302 04/30/16 0027  NA 139 139  K 3.8 4.0  CL 107 106  CO2 24 27  GLUCOSE 114* 104*  BUN 27* 21*  CREATININE 1.06 0.91  CALCIUM 9.1 9.1  MG 1.9 1.9   No results for input(s): AST, ALT, ALKPHOS, BILITOT, PROT, ALBUMIN in the last 72 hours.  Recent Labs  04/29/16 0302  WBC 5.9  HGB 14.3  HCT 39.9*  MCV 95.1  PLT 168    Recent Labs  04/29/16 0302 04/29/16 1203 04/29/16 1803 04/30/16 0027  TROPONINI <0.03 <0.03 <0.03 <0.03   Invalid input(s): POCBNP  Recent Labs  04/29/16 0302  HGBA1C 5.6  Weights: Filed Weights   04/29/16 0600 04/30/16 0039 04/30/16 0500  Weight: 195 lb 5.2 oz (88.6 kg) 194 lb 9.6 oz (88.3 kg) 192 lb 3 oz (87.2 kg)     Radiology/Studies:  No results found.   Assessment and Recommendation  82 y.o. male with known paroxysmal nonvalvular atrial fibrillation with rapid ventricular rate causing anginal symptoms fully relieved with a heart rate control and no current evidence of myocardial infarction. Patient does have coronary artery disease with sleep apnea stable on appropriate medication management. Patient has had additional amiodarone instead of Tikosyn due to hypokalemia and  other concerns and tolerating well 1. Continue amiodarone orally for maintenance of normal sinus rhythmAnd continue orally at 400 mg each day 2. Discontinuation of Tikosyn due to concerns of a difficulty in using and multiple episodes of hypokalemia 3. No change in anticoagulation for further risk reduction in stroke with atrial fibrillation 4. Carvedilol and Ranexa for anginal symptoms 5. Begin ambulation and follow for any further significant symptoms rhythm disturbances and possible discharge home if feeling well with controlled heart rate and follow-up in one week for further adjustments of medication management and possible decrease amiodarone dose at that time  Signed, Arnoldo HookerBruce Reza Crymes M.D. FACC

## 2016-04-30 NOTE — Progress Notes (Signed)
[facility]  Patient: Keith PilonCarroll C Mallet / Admit Date: 04/29/2016 / Date of Encounter: 04/30/2016, 8:28 AM   Subjective: Patient feeling better today. Patient ambulating without significant symptoms although some periods of rapid heart rate. Patient remains in normal sinus rhythm at this time on appropriate medication management. No evidence of myocardial infarction with normal troponin  Review of Systems: Positive for: Shortness of breath Negative for: Vision change, hearing change, syncope, dizziness, nausea, vomiting,diarrhea, bloody stool, stomach pain, cough, congestion, diaphoresis, urinary frequency, urinary pain,skin lesions, skin rashes Others previously listed  Objective: Telemetry: Normal sinus rhythm Physical Exam: Blood pressure (!) 144/86, pulse 67, temperature 97.4 F (36.3 C), temperature source Oral, resp. rate 18, height 5\' 7"  (1.702 m), weight 192 lb 3 oz (87.2 kg), SpO2 99 %. Body mass index is 30.1 kg/m. General: Well developed, well nourished, in no acute distress. Head: Normocephalic, atraumatic, sclera non-icteric, no xanthomas, nares are without discharge. Neck: No apparent masses Lungs: Normal respirations with no wheezes, no rhonchi, no rales , no crackles   Heart: Regular rate and rhythm, normal S1 S2, no murmur, no rub, no gallop, PMI is normal size and placement, carotid upstroke normal without bruit, jugular venous pressure normal Abdomen: Soft, non-tender, non-distended with normoactive bowel sounds. No hepatosplenomegaly. Abdominal aorta is normal size without bruit Extremities: No edema, no clubbing, no cyanosis, no ulcers,  Peripheral: 2+ radial, 2+ femoral, 2+ dorsal pedal pulses Neuro: Alert and oriented. Moves all extremities spontaneously. Psych:  Responds to questions appropriately with a normal affect.   Intake/Output Summary (Last 24 hours) at 04/30/16 0828 Last data filed at 04/30/16 0358  Gross per 24 hour   Intake                0 ml  Output             1900 ml  Net            -1900 ml    Inpatient Medications:  . amiodarone  200 mg Oral Daily  . aspirin  81 mg Oral Daily  . carvedilol  12.5 mg Oral BID WC  . cholecalciferol  1,000 Units Oral Daily  . dabigatran  150 mg Oral BID  . docusate sodium  100 mg Oral BID  . donepezil  10 mg Oral QHS  . famotidine  20 mg Oral BID  . fluticasone  1 spray Each Nare Daily  . polyvinyl alcohol  1 drop Both Eyes Daily  . ranolazine  500 mg Oral BID  . sodium chloride flush  3 mL Intravenous Q12H  . tamsulosin  0.4 mg Oral QPC supper  . thiamine  50 mg Oral Daily   Infusions:    Labs:  Recent Labs  04/29/16 0302 04/30/16 0027  NA 139 139  K 3.8 4.0  CL 107 106  CO2 24 27  GLUCOSE 114* 104*  BUN 27* 21*  CREATININE 1.06 0.91  CALCIUM 9.1 9.1  MG 1.9 1.9   No results for input(s): AST, ALT, ALKPHOS, BILITOT, PROT, ALBUMIN in the last 72 hours.  Recent Labs  04/29/16 0302  WBC 5.9  HGB 14.3  HCT 39.9*  MCV 95.1  PLT 168    Recent Labs  04/29/16 0302 04/29/16 1203 04/29/16 1803 04/30/16 0027  TROPONINI <0.03 <0.03 <0.03 <0.03   Invalid input(s): POCBNP  Recent Labs  04/29/16 0302  HGBA1C 5.6     Weights: Filed Weights   04/29/16 0600 04/30/16 0039 04/30/16  0500  Weight: 195 lb 5.2 oz (88.6 kg) 194 lb 9.6 oz (88.3 kg) 192 lb 3 oz (87.2 kg)     Radiology/Studies:  No results found.   Assessment and Recommendation  82 y.o. male with known paroxysmal nonvalvular atrial fibrillation with rapid ventricular rate causing anginal symptoms fully relieved with a heart rate control and no current evidence of myocardial infarction. Patient does have coronary artery disease with sleep apnea stable on appropriate medication management. Patient has had additional amiodarone instead of Tikosyn due to hypokalemia and other concerns and tolerating well 1. Continue amiodarone orally for maintenance of normal sinus rhythm 2.  Discontinuation of Tikosyn due to concerns of a difficulty in using and multiple episodes of hypokalemia 3. No change in anticoagulation for further risk reduction in stroke with atrial fibrillation 4. Carvedilol and Ranexa for anginal symptoms 5. Begin ambulation and follow for any further significant symptoms rhythm disturbances and possible discharge home if feeling well with controlled heart rate and follow-up in one to 2 weeks for further adjustments of medication management  Signed, Arnoldo Hooker M.D. FACC

## 2016-04-30 NOTE — Care Management (Signed)
Patient presents from home with chest pain and found to be in rapid atrial fib.  She was placed on an amiodarone drip which has since been discontinued and now on oral amiodarone.  Has cpap at home.  PT consult is pending.  At present have not identified any discharge needs.

## 2016-04-30 NOTE — Progress Notes (Signed)
Pt. Slept throughout the night with Bi-pap in place. No signs or c/o pain, discomfort, SOB or acute distress observed. Pt. Did convert from SR/SB to A-fib.  Pt. Ran a few PVC's.  No distress noted. Will continue to monitor pt.

## 2016-05-01 LAB — BASIC METABOLIC PANEL WITH GFR
Anion gap: 7 (ref 5–15)
BUN: 17 mg/dL (ref 6–20)
CO2: 26 mmol/L (ref 22–32)
Calcium: 9.1 mg/dL (ref 8.9–10.3)
Chloride: 106 mmol/L (ref 101–111)
Creatinine, Ser: 0.96 mg/dL (ref 0.61–1.24)
GFR calc Af Amer: >60 mL/min
GFR calc non Af Amer: >60 mL/min
Glucose, Bld: 101 mg/dL — ABNORMAL HIGH (ref 65–99)
Potassium: 3.6 mmol/L (ref 3.5–5.1)
Sodium: 139 mmol/L (ref 135–145)

## 2016-05-01 MED ORDER — AMIODARONE HCL 400 MG PO TABS
400.0000 mg | ORAL_TABLET | Freq: Every day | ORAL | 0 refills | Status: DC
Start: 2016-05-01 — End: 2016-09-21

## 2016-05-01 NOTE — Progress Notes (Signed)
Patient is alert and oriented, vss, no complaints of pain.  Removed telemetry and removed PIVs.  Family present at bedside.  Appts are made.  No questions at this time.  Patient to be escorted out of hospital via wheelchair by volunteers.

## 2016-05-01 NOTE — Care Management (Signed)
No discharge needs. 

## 2016-05-01 NOTE — Discharge Summary (Signed)
[facility] [place] [facility]   PATIENT NAME: Keith Estes    MR#:  [id number]  DATE OF BIRTH:  01/13/1929  DATE OF ADMISSION:  04/29/2016   ADMITTING PHYSICIAN: Arnaldo Natal, MD  DATE OF DISCHARGE: No discharge date for patient encounter.  PRIMARY CARE PHYSICIAN: No PCP Per Patient   ADMISSION DIAGNOSIS:   Ventricular tachycardia (HCC) [I47.2] Atrial fibrillation with rapid ventricular response (HCC) [I48.91] Chest pain, unspecified chest pain type [R07.9]  DISCHARGE DIAGNOSIS:   Active Problems:   Chest pain   SECONDARY DIAGNOSIS:   Past Medical History:  Diagnosis Date  . COPD (chronic obstructive pulmonary disease) (HCC)   . Coronary artery disease 2012   LHC: LM 30%, mLAD 50%, D2 40%, OM1 70%, LCX 20% ISR, 50% RCA; patient reports no intervention performed    HOSPITAL COURSE:   82 year old male with past medical history significant for paroxysmal atrial fibrillation on tikosyn and Pradaxa, CAD status post prior stents and complex three-vessel disease on medical management, hypertension and mild dementia presents to hospital secondary to chest pain and diaphoresis.  #1 atrial fibrillation with rapid wide complex ventricular rate-presenting with angina and dyspnea. -Per his cardiologist at [facility], patient's paroxysmal atrial fibrillation has always been presenting with angina and dyspnea symptoms. -Converted to normal sinus rhythm on IV amiodarone. Changed to oral amiodarone 400mg  qdaily, which is helping to maintain the NSR now- patient is asymptomatic with NSR -Troponins have remained negative. Continue Pradaxa for anticoagulation. - Continue Coreg- at 12.5mg  BID -continue to keep his appt with [facility] cardiology as outpatient in 2 days  #2 CAD-troponins negative so far. No indication for cardiac catheterization. -echocardiogram done but pending. Monitor closely. -On aspirin, Coreg, Ranexa. Not on statin due to intolerance  #3  dementia-stable at baseline. On Aricept  #4 BPH-on Flomax  Feels great, ambulating well. For discharge today   DISCHARGE CONDITIONS:   Stable  CONSULTS OBTAINED:   Treatment Team:  Lamar Blinks, MD  DRUG ALLERGIES:   Allergies  Allergen Reactions  . Diltiazem Other (See Comments)    Headache, GI upset  . Hydrocodone Nausea And Vomiting  . Lipitor [Atorvastatin] Other (See Comments)    Myalgias  . Lisinopril Cough  . Percodan [Oxycodone-Aspirin] Nausea And Vomiting  . Vicodin [Hydrocodone-Acetaminophen] Nausea And Vomiting   DISCHARGE MEDICATIONS:     Medication List    STOP taking these medications   dofetilide 500 MCG capsule Commonly known as:  TIKOSYN     TAKE these medications   albuterol (2.5 MG/3ML) 0.083% nebulizer solution Commonly known as:  PROVENTIL Take 2.5 mg by nebulization every 6 (six) hours as needed for wheezing or shortness of breath.   ALPRAZolam 0.5 MG tablet Commonly known as:  XANAX Take 0.5 mg by mouth at bedtime as needed for anxiety.   amiodarone 400 MG tablet Commonly known as:  PACERONE Take 1 tablet (400 mg total) by mouth daily.   aspirin 81 MG tablet Take 81 mg by mouth daily.   carvedilol 12.5 MG tablet Commonly known as:  COREG Take 12.5 mg by mouth 2 (two) times daily with a meal.   Cholecalciferol 10000 units Tabs Take by mouth.   donepezil 10 MG tablet Commonly known as:  ARICEPT Take 10 mg by mouth at bedtime.   fluticasone 50 MCG/ACT nasal spray Commonly known as:  FLONASE Place 1 spray into both nostrils daily.   nitroGLYCERIN 0.4 MG SL tablet Commonly known as:  NITROSTAT Place 0.4  mg under the tongue every 5 (five) minutes as needed for chest pain.   PRADAXA 150 MG Caps capsule Generic drug:  dabigatran Take 150 mg by mouth 2 (two) times daily.   ranitidine 150 MG tablet Commonly known as:  ZANTAC Take 150 mg by mouth 2 (two) times daily.   ranolazine 500 MG 12 hr tablet Commonly known  as:  RANEXA Take 500 mg by mouth 2 (two) times daily.   SYSTANE BALANCE 0.6 % Soln Generic drug:  Propylene Glycol Apply 1 drop to eye daily.   tamsulosin 0.4 MG Caps capsule Commonly known as:  FLOMAX Take 0.4 mg by mouth.   thiamine 50 MG tablet Commonly known as:  VITAMIN B-1 Take 50 mg by mouth daily.        DISCHARGE INSTRUCTIONS:   1. Follow-up with EP cardiology in 2 days 2. PCP follow-up in 1-2 weeks 3. cardiology follow up in 2-3 weeks  DIET:   Cardiac diet  ACTIVITY:   Activity as tolerated  OXYGEN:   Home Oxygen: No.  Oxygen Delivery: room air  DISCHARGE LOCATION:   home   If you experience worsening of your admission symptoms, develop shortness of breath, life threatening emergency, suicidal or homicidal thoughts you must seek medical attention immediately by calling 911 or calling your MD immediately  if symptoms less severe.  You Must read complete instructions/literature along with all the possible adverse reactions/side effects for all the Medicines you take and that have been prescribed to you. Take any new Medicines after you have completely understood and accpet all the possible adverse reactions/side effects.   Please note  You were cared for by a hospitalist during your hospital stay. If you have any questions about your discharge medications or the care you received while you were in the hospital after you are discharged, you can call the unit and asked to speak with the hospitalist on call if the hospitalist that took care of you is not available. Once you are discharged, your primary care physician will handle any further medical issues. Please note that NO REFILLS for any discharge medications will be authorized once you are discharged, as it is imperative that you return to your primary care physician (or establish a relationship with a primary care physician if you do not have one) for your aftercare needs so that they can reassess your need  for medications and monitor your lab values.    On the day of Discharge:  VITAL SIGNS:   Blood pressure 135/82, pulse 65, temperature 97.4 F (36.3 C), temperature source Oral, resp. rate 16, height 5\' 7"  (1.702 m), weight 87.2 kg (192 lb 3.2 oz), SpO2 93 %.  PHYSICAL EXAMINATION:    GENERAL:  82 y.o.-year-old patient lying in the bed with no acute distress.  EYES: Pupils equal, round, reactive to light and accommodation. No scleral icterus. Extraocular muscles intact.  HEENT: Head atraumatic, normocephalic. Oropharynx and nasopharynx clear.  NECK:  Supple, no jugular venous distention. No thyroid enlargement, no tenderness.  LUNGS: Normal breath sounds bilaterally, no wheezing, rales,rhonchi or crepitation. No use of accessory muscles of respiration.  CARDIOVASCULAR: S1, S2 normal. No  rubs, or gallops. 3/6 systolic murmur is present ABDOMEN: Soft, nontender, nondistended. Bowel sounds present. No organomegaly or mass.  EXTREMITIES: No pedal edema, cyanosis, or clubbing.  NEUROLOGIC: Cranial nerves II through XII are intact. Muscle strength 5/5 in all extremities. Sensation intact. Gait not checked.  PSYCHIATRIC: The patient is alert and oriented x 3.  SKIN: No obvious rash, lesion, or ulcer.    DATA REVIEW:   CBC  Recent Labs Lab 04/29/16 0302  WBC 5.9  HGB 14.3  HCT 39.9*  PLT 168    Chemistries   Recent Labs Lab 04/30/16 0027 05/01/16 0359  NA 139 139  K 4.0 3.6  CL 106 106  CO2 27 26  GLUCOSE 104* 101*  BUN 21* 17  CREATININE 0.91 0.96  CALCIUM 9.1 9.1  MG 1.9  --      Microbiology Results  Results for orders placed or performed during the hospital encounter of 04/29/16  MRSA PCR Screening     Status: None   Collection Time: 04/29/16  6:15 AM  Result Value Ref Range Status   MRSA by PCR NEGATIVE NEGATIVE Final    Comment:        The GeneXpert MRSA Assay (FDA approved for NASAL specimens only), is one component of a comprehensive MRSA  colonization surveillance program. It is not intended to diagnose MRSA infection nor to guide or monitor treatment for MRSA infections.     RADIOLOGY:  No results found.   Management plans discussed with the patient, family and they are in agreement.  CODE STATUS:     Code Status Orders        Start     Ordered   04/29/16 0556  Full code  Continuous     04/29/16 0555    Code Status History    Date Active Date Inactive Code Status Order ID Comments User Context   04/29/2016  5:55 AM 04/30/2016  8:33 AM Full Code [id number]  Arnaldo NatalMichael S Diamond, MD Inpatient      TOTAL TIME TAKING CARE OF THIS PATIENT: 37 minutes.    Keith BaasKALISETTI,Amiree No M.D on 05/01/2016 at 9:10 AM  Between 7am to 6pm - Pager - [phone]  After 6pm go to [email or web address] - Scientist, research (life [email or web address])password [email or web address]  [facility] [place] [facility]  Office  [phone]  CC: Primary care physician; No PCP Per Patient   Note: This dictation was prepared with Dragon dictation along with smaller phrase technology. Any transcriptional errors that result from this process are unintentional.

## 2016-05-15 DIAGNOSIS — J321 Chronic frontal sinusitis: Secondary | ICD-10-CM

## 2016-05-15 HISTORY — DX: Chronic frontal sinusitis: J32.1

## 2016-06-08 ENCOUNTER — Emergency Department
Admission: EM | Admit: 2016-06-08 | Discharge: 2016-06-08 | Disposition: A | Discharge status: Home / Self Care | Payer: Medicare Other | Attending: Emergency Medicine | Admitting: Emergency Medicine

## 2016-06-08 ENCOUNTER — Encounter: Payer: Self-pay | Admitting: Emergency Medicine

## 2016-06-08 ENCOUNTER — Emergency Department: Payer: Medicare Other

## 2016-06-08 DIAGNOSIS — R0789 Other chest pain: Secondary | ICD-10-CM | POA: Insufficient documentation

## 2016-06-08 DIAGNOSIS — I2510 Atherosclerotic heart disease of native coronary artery without angina pectoris: Secondary | ICD-10-CM | POA: Diagnosis not present

## 2016-06-08 DIAGNOSIS — Z7982 Long term (current) use of aspirin: Secondary | ICD-10-CM | POA: Insufficient documentation

## 2016-06-08 DIAGNOSIS — J449 Chronic obstructive pulmonary disease, unspecified: Secondary | ICD-10-CM | POA: Insufficient documentation

## 2016-06-08 DIAGNOSIS — Z79899 Other long term (current) drug therapy: Secondary | ICD-10-CM | POA: Diagnosis not present

## 2016-06-08 LAB — TROPONIN I: Troponin I: <0.03 ng/mL (ref <0.03)

## 2016-06-08 LAB — CBC
HEMATOCRIT: 41.5 % (ref 40.0–52.0)
HEMOGLOBIN: 14.7 g/dL (ref 13.0–18.0)
MCH: 33.9 pg (ref 26.0–34.0)
MCHC: 35.3 g/dL (ref 32.0–36.0)
MCV: 96.2 fL (ref 80.0–100.0)
Platelets: 181 10*3/uL (ref 150–440)
RBC: 4.32 MIL/uL — ABNORMAL LOW (ref 4.40–5.90)
RDW: 14.1 % (ref 11.5–14.5)
WBC: 6.3 10*3/uL (ref 3.8–10.6)

## 2016-06-08 LAB — COMPREHENSIVE METABOLIC PANEL
ALBUMIN: 3.9 g/dL (ref 3.5–5.0)
ALT: 28 U/L (ref 17–63)
AST: 24 U/L (ref 15–41)
Alkaline Phosphatase: 63 U/L (ref 38–126)
Anion gap: 9 (ref 5–15)
BILIRUBIN TOTAL: 1.1 mg/dL (ref 0.3–1.2)
BUN: 14 mg/dL (ref 6–20)
CO2: 24 mmol/L (ref 22–32)
CREATININE: 0.98 mg/dL (ref 0.61–1.24)
Calcium: 9.3 mg/dL (ref 8.9–10.3)
Chloride: 106 mmol/L (ref 101–111)
GFR calc Af Amer: >60 mL/min (ref >60)
GFR calc non Af Amer: >60 mL/min (ref >60)
GLUCOSE: 107 mg/dL — AB (ref 65–99)
Potassium: 4.1 mmol/L (ref 3.5–5.1)
SODIUM: 139 mmol/L (ref 135–145)
TOTAL PROTEIN: 7.4 g/dL (ref 6.5–8.1)

## 2016-06-08 LAB — LIPASE, BLOOD: Lipase: 21 U/L (ref 11–51)

## 2016-06-08 NOTE — ED Notes (Signed)
Pt called out asking to use the bathroom. Pt given urinal and assisted to the side of the bed. No other needs expressed.

## 2016-06-08 NOTE — ED Notes (Signed)
Upon assessment pt has left arm pain when undressing.

## 2016-06-08 NOTE — ED Triage Notes (Signed)
Pt arrived via ems from home with complaints of increased pain to the left chest that has progressively gotten worse over the last week. Pt states the pain feels like a stabbing pain with a deep breath. Pt took NTG last night without relief. Pt took 2 tylenol this morning with some relief of pain. Upon arrival pt was able to ambulate from ems stretcher to bed. EMS reports NSR and vital WDL.

## 2016-06-08 NOTE — Discharge Instructions (Signed)
You have been seen in the Emergency Department (ED) today for chest pain.  As we have discussed today?s test results are normal, and we believe your pain is due to pain/strain and/or inflammation of the muscles and/or cartilage of your chest wall.  We recommend you take Tylenol four times a day according to the label instructions until you follow up with your cardiologist.  Read through the included information for additional treatment recommendations and precautions.  Continue to take your regular medications.   Return to the Emergency Department (ED) if you experience any further chest pain/pressure/tightness, difficulty breathing, or sudden sweating, or other symptoms that concern you.

## 2016-06-08 NOTE — ED Notes (Signed)
Pt given warm blanket. Pt also requested remote but was unable to find one. Assisted pt in finding a channel of his choice, no other needs requested.

## 2016-06-08 NOTE — ED Provider Notes (Signed)
[facility] Emergency Department Provider Note  ____________________________________________   First MD Initiated Contact with Patient 06/08/16 1815     (approximate)  I have reviewed the triage vital signs and the nursing notes.   HISTORY  Chief Complaint Chest Pain    HPI Keith Estes is a 82 y.o. male with a medical history that includes chronic atrial fibrillation on amiodarone as well as Pradaxa and low-dose aspirin.  He presents for evaluation for what he describes as one week of gradual onset aching pain in the left side of his chest.  It is reproduced with movement of his torso and raising up his left arm.  It was helped earlier today when he took some Tylenol .  He is not short of breath and notes that exercise does not worsen the chest discomfort.  EMS also mentions that the patient walked up a flight of stairs without any difficulty when he walked to the ambulance.  He denies recent fever/chills, cough, shortness of breath, abdominal pain, nausea, vomiting, diaphoresis, dysuria.  He also denies any abdominal pain.He describes the chest pain as mild to moderate but states that it is not gone away over the last approximately 1 week so he thought he should get it checked out.   Past Medical History:  Diagnosis Date  . COPD (chronic obstructive pulmonary disease) (HCC)   . Coronary artery disease 2012   LHC: LM 30%, mLAD 50%, D2 40%, OM1 70%, LCX 20% ISR, 50% RCA; patient reports no intervention performed    Patient Active Problem List   Diagnosis Date Noted  . Chest pain 04/29/2016    Past Surgical History:  Procedure Laterality Date  . APPENDECTOMY    . CARDIOVASCULAR STRESS TEST  2014   [facility]: NM: Stress nuclear: No evidence of ischemia, EF: 58% - No significant change from 2011  . HERNIA REPAIR      Prior to Admission medications   Medication Sig Start Date End Date Taking? Authorizing Provider  albuterol (PROVENTIL) (2.5 MG/3ML)  0.083% nebulizer solution Take 2.5 mg by nebulization every 6 (six) hours as needed for wheezing or shortness of breath.   Yes Historical Provider, MD  ALPRAZolam Prudy Feeler(XANAX) 0.5 MG tablet Take 0.5 mg by mouth at bedtime as needed for anxiety.   Yes Historical Provider, MD  amiodarone (PACERONE) 400 MG tablet Take 1 tablet (400 mg total) by mouth daily. Patient taking differently: Take 200 mg by mouth daily.  05/01/16  Yes Enid Baasadhika Kalisetti, MD  aspirin 81 MG tablet Take 81 mg by mouth daily. Also Took 162mg  at 1730 06/08/2016   Yes Historical Provider, MD  B Complex-C (B-COMPLEX WITH VITAMIN C) tablet Take 1 tablet by mouth daily.   Yes Historical Provider, MD  carvedilol (COREG) 12.5 MG tablet Take 12.5 mg by mouth 2 (two) times daily with a meal.   Yes Historical Provider, MD  Cholecalciferol 10000 units TABS Take by mouth.   Yes Historical Provider, MD  dabigatran (PRADAXA) 150 MG CAPS capsule Take 150 mg by mouth 2 (two) times daily.   Yes Historical Provider, MD  donepezil (ARICEPT) 10 MG tablet Take 10 mg by mouth at bedtime.   Yes Historical Provider, MD  fluticasone (FLONASE) 50 MCG/ACT nasal spray Place 1 spray into both nostrils daily.   Yes Historical Provider, MD  nitroGLYCERIN (NITROSTAT) 0.4 MG SL tablet Place 0.4 mg under the tongue every 5 (five) minutes as needed for chest pain.   Yes Historical Provider, MD  omeprazole (PRILOSEC) 40 MG capsule Take 40 mg by mouth daily.   Yes Historical Provider, MD  Propylene Glycol (SYSTANE BALANCE) 0.6 % SOLN Apply 1 drop to eye daily.    Yes Historical Provider, MD  ranitidine (ZANTAC) 150 MG tablet Take 150 mg by mouth 2 (two) times daily.   Yes Historical Provider, MD  ranolazine (RANEXA) 500 MG 12 hr tablet Take 500 mg by mouth 2 (two) times daily.   Yes Historical Provider, MD  tamsulosin (FLOMAX) 0.4 MG CAPS capsule Take 0.4 mg by mouth.   Yes Historical Provider, MD  traZODone (DESYREL) 50 MG tablet Take 1 tablet by mouth at bedtime. 05/08/16   Yes Historical Provider, MD    Allergies Diltiazem; Hydrocodone; Lipitor [atorvastatin]; Lisinopril; Percodan [oxycodone-aspirin]; and Vicodin [hydrocodone-acetaminophen]  Family History  Problem Relation Age of Onset  . Heart disease Brother     Social History Social History  Substance Use Topics  . Smoking status: Never Smoker  . Smokeless tobacco: Never Used  . Alcohol use No    Review of Systems Constitutional: No fever/chills Eyes: No visual changes. ENT: No sore throat. Cardiovascular: Left-sided anterior wall chest pain Respiratory: Denies shortness of breath. Gastrointestinal: No abdominal pain.  No nausea, no vomiting.  No diarrhea.  No constipation. Genitourinary: Negative for dysuria. Musculoskeletal: Negative for back pain. Skin: Negative for rash. Neurological: Negative for headaches, focal weakness or numbness.  10-point ROS otherwise negative.  ____________________________________________   PHYSICAL EXAM:  VITAL SIGNS: ED Triage Vitals  Enc Vitals Group     BP 06/08/16 1812 (!) 172/105     Pulse Rate 06/08/16 1810 62     Resp 06/08/16 1810 16     Temp 06/08/16 1810 97.6 F (36.4 C)     Temp Source 06/08/16 1810 Oral     SpO2 06/08/16 1810 98 %     Weight 06/08/16 1811 200 lb (90.7 kg)     Height 06/08/16 1811 5\' 6"  (1.676 m)     Head Circumference --      Peak Flow --      Pain Score 06/08/16 1815 2     Pain Loc --      Pain Edu? --      Excl. in GC? --     Constitutional: Alert and oriented. Well appearing and in no acute distress. Eyes: Conjunctivae are normal. PERRL. EOMI. Head: Atraumatic. Nose: No congestion/rhinnorhea. Mouth/Throat: Mucous membranes are moist.  Oropharynx non-erythematous. Neck: No stridor.  No meningeal signs.   Cardiovascular: Normal rate, regular rhythm. Good peripheral circulation. Grossly normal heart sounds. Respiratory: Normal respiratory effort.  No retractions. Lungs CTAB. Highly reproducible left chest  wall tenderness to palpation as well as when the patient lifted his left arm and move it around change into his gown. Gastrointestinal: Soft and nontender. No distention.  Musculoskeletal: No lower extremity tenderness nor edema. No gross deformities of extremities. Neurologic:  Normal speech and language. No gross focal neurologic deficits are appreciated.  Skin:  Skin is warm, dry and intact. No rash noted. Psychiatric: Mood and affect are normal. Speech and behavior are normal.  ____________________________________________   LABS (all labs ordered are listed, but only abnormal results are displayed)  Labs Reviewed  CBC - Abnormal; Notable for the following:       Result Value   RBC 4.32 (*)    All other components within normal limits  COMPREHENSIVE METABOLIC PANEL - Abnormal; Notable for the following:    Glucose, Bld 107 (*)  All other components within normal limits  TROPONIN I  LIPASE, BLOOD   ____________________________________________  EKG  ED ECG REPORT I, Charmel Pronovost, the attending physician, personally viewed and interpreted this ECG.  Date: 06/08/2016 EKG Time: 18:09 Rate: 62 Rhythm: Atrial fibrillation QRS Axis: normal Intervals: normal ST/T Wave abnormalities: Inverted T-wave in lead 3, otherwise unremarkable Conduction Disturbances: none Narrative Interpretation: unremarkable  ____________________________________________  RADIOLOGY   Dg Chest 2 View  Result Date: 06/08/2016 CLINICAL DATA:  Left site anterior chest wall pain EXAM: CHEST  2 VIEW COMPARISON:  09/20/2014 FINDINGS: Cardiomediastinal silhouette is stable. Mild hyperinflation. Stable left basilar atelectasis or scarring. No acute infiltrate or pulmonary edema. Osteopenia and mild degenerative changes thoracic spine. IMPRESSION: No active cardiopulmonary disease. Electronically Signed   By: Natasha Mead M.D.   On: 06/08/2016 19:29     ____________________________________________   PROCEDURES  Procedure(s) performed:   Procedures   Critical Care performed: No ____________________________________________   INITIAL IMPRESSION / ASSESSMENT AND PLAN / ED COURSE  Pertinent labs & imaging results that were available during my care of the patient were reviewed by me and considered in my medical decision making (see chart for details).  The patient is well-appearing and in no acute distress.  He has hypertension at the moment but does not seem to be symptomatic from it.  His chest wall pain is reproducible with palpation as well as with movement of the left arm.  He is on Pradaxa and a low-dose aspirin.  I have very minimal concern that this chest pain represents a life-threatening or acute/emergent medical conditions such as ACS, aortic dissection, pulmonary embolism.  We will perform the standard cardiology evaluation and anticipate he will likely be able to follow-up with his [facility] cardiologist if the workup is reassuring.   Clinical Course  Comment By Time  Patient waiting calmly.  Continues to have chest wall pain with movement.  His workup has been reassuring.  His reassuring and I do not believe he needs a second one.  We discussed the probability of musculoskeletal pain and I encouraged him to take Tylenol 4 times a day until he follows up with his cardiologist or primary care doctor.  I gave my usual customary return precautions and he and his wife are comfortable with this plan. Loleta Rose, MD 10/20 2029    ____________________________________________  FINAL CLINICAL IMPRESSION(S) / ED DIAGNOSES  Final diagnoses:  Atypical chest pain  Chest wall pain     MEDICATIONS GIVEN DURING THIS VISIT:  Medications - No data to display   NEW OUTPATIENT MEDICATIONS STARTED DURING THIS VISIT:  New Prescriptions   No medications on file    Modified Medications   No medications on file    Discontinued  Medications   THIAMINE (VITAMIN B-1) 50 MG TABLET    Take 50 mg by mouth daily.     Note:  This document was prepared using Dragon voice recognition software and may include unintentional dictation errors.    Loleta Rose, MD 06/08/16 2031

## 2016-07-23 ENCOUNTER — Ambulatory Visit
Admission: EM | Admit: 2016-07-23 | Discharge: 2016-07-23 | Disposition: A | Discharge status: Home / Self Care | Payer: Medicare Other | Attending: Family Medicine | Admitting: Family Medicine

## 2016-07-23 ENCOUNTER — Encounter: Payer: Self-pay | Admitting: *Deleted

## 2016-07-23 DIAGNOSIS — J0100 Acute maxillary sinusitis, unspecified: Secondary | ICD-10-CM | POA: Diagnosis not present

## 2016-07-23 DIAGNOSIS — H6503 Acute serous otitis media, bilateral: Secondary | ICD-10-CM

## 2016-07-23 MED ORDER — AMOXICILLIN 875 MG PO TABS: 875.0000 mg | ORAL_TABLET | Freq: Two times a day (BID) | ORAL | 0 refills | Status: DC

## 2016-07-23 MED ORDER — BENZONATATE 100 MG PO CAPS: 100.0000 mg | ORAL_CAPSULE | Freq: Three times a day (TID) | ORAL | 0 refills | Status: DC

## 2016-07-23 NOTE — ED Triage Notes (Signed)
Patient started having symptoms of nasal congestion and drainage 1 month ago. OTC medication have not resolved symptoms. Additional symptom of productive cough is present.

## 2016-07-23 NOTE — ED Provider Notes (Signed)
[facility]    CSN: [id number] Arrival date & time: 07/23/16  1050     History   Chief Complaint Chief Complaint  Patient presents with  . Nasal Congestion  . Cough    HPI Keith Estes is a 82 y.o. male.   The history is provided by the patient.  URI  Presenting symptoms: congestion, cough, ear pain and facial pain   Severity:  Moderate Onset quality:  Sudden Duration:  1 month Timing:  Constant Progression:  Unchanged Chronicity:  New Relieved by:  Nothing Worsened by:  Nothing Ineffective treatments:  OTC medications Associated symptoms: headaches and sinus pain   Associated symptoms: no wheezing   Risk factors: being elderly, chronic cardiac disease and chronic respiratory disease   Risk factors: no immunosuppression, no recent illness, no recent travel and no sick contacts     Past Medical History:  Diagnosis Date  . COPD (chronic obstructive pulmonary disease) (HCC)   . Coronary artery disease 2012   LHC: LM 30%, mLAD 50%, D2 40%, OM1 70%, LCX 20% ISR, 50% RCA; patient reports no intervention performed    Patient Active Problem List   Diagnosis Date Noted  . Chest pain 04/29/2016    Past Surgical History:  Procedure Laterality Date  . APPENDECTOMY    . CARDIOVASCULAR STRESS TEST  2014   [facility]: NM: Stress nuclear: No evidence of ischemia, EF: 58% - No significant change from 2011  . HERNIA REPAIR         Home Medications    Prior to Admission medications   Medication Sig Start Date End Date Taking? Authorizing Provider  albuterol (PROVENTIL) (2.5 MG/3ML) 0.083% nebulizer solution Take 2.5 mg by nebulization every 6 (six) hours as needed for wheezing or shortness of breath.   Yes Historical Provider, MD  amiodarone (PACERONE) 400 MG tablet Take 1 tablet (400 mg total) by mouth daily. Patient taking differently: Take 200 mg by mouth daily.  05/01/16  Yes Enid Baasadhika Kalisetti, MD  aspirin 81 MG tablet Take 81 mg by mouth daily. Also  Took 162mg  at 1730 06/08/2016   Yes Historical Provider, MD  B Complex-C (B-COMPLEX WITH VITAMIN C) tablet Take 1 tablet by mouth daily.   Yes Historical Provider, MD  carvedilol (COREG) 12.5 MG tablet Take 12.5 mg by mouth 2 (two) times daily with a meal.   Yes Historical Provider, MD  Cholecalciferol 10000 units TABS Take by mouth.   Yes Historical Provider, MD  dabigatran (PRADAXA) 150 MG CAPS capsule Take 150 mg by mouth 2 (two) times daily.   Yes Historical Provider, MD  donepezil (ARICEPT) 10 MG tablet Take 10 mg by mouth at bedtime.   Yes Historical Provider, MD  fluticasone (FLONASE) 50 MCG/ACT nasal spray Place 1 spray into both nostrils daily.   Yes Historical Provider, MD  omeprazole (PRILOSEC) 40 MG capsule Take 40 mg by mouth daily.   Yes Historical Provider, MD  Propylene Glycol (SYSTANE BALANCE) 0.6 % SOLN Apply 1 drop to eye daily.    Yes Historical Provider, MD  ranitidine (ZANTAC) 150 MG tablet Take 150 mg by mouth 2 (two) times daily.   Yes Historical Provider, MD  ranolazine (RANEXA) 500 MG 12 hr tablet Take 500 mg by mouth 2 (two) times daily.   Yes Historical Provider, MD  tamsulosin (FLOMAX) 0.4 MG CAPS capsule Take 0.4 mg by mouth.   Yes Historical Provider, MD  traZODone (DESYREL) 50 MG tablet Take 1 tablet by mouth at bedtime.  05/08/16  Yes Historical Provider, MD  ALPRAZolam Prudy Feeler(XANAX) 0.5 MG tablet Take 0.5 mg by mouth at bedtime as needed for anxiety.    Historical Provider, MD  amoxicillin (AMOXIL) 875 MG tablet Take 1 tablet (875 mg total) by mouth 2 (two) times daily. 07/23/16   Payton Mccallumrlando Jalila Goodnough, MD  benzonatate (TESSALON) 100 MG capsule Take 1 capsule (100 mg total) by mouth every 8 (eight) hours. 07/23/16   Payton Mccallumrlando Brailen Macneal, MD  nitroGLYCERIN (NITROSTAT) 0.4 MG SL tablet Place 0.4 mg under the tongue every 5 (five) minutes as needed for chest pain.    Historical Provider, MD    Family History Family History  Problem Relation Age of Onset  . Heart disease Brother      Social History Social History  Substance Use Topics  . Smoking status: Never Smoker  . Smokeless tobacco: Never Used  . Alcohol use No     Allergies   Diltiazem; Hydrocodone; Lipitor [atorvastatin]; Lisinopril; Percodan [oxycodone-aspirin]; and Vicodin [hydrocodone-acetaminophen]   Review of Systems Review of Systems  HENT: Positive for congestion, ear pain and sinus pain.   Respiratory: Positive for cough. Negative for wheezing.   Neurological: Positive for headaches.     Physical Exam Triage Vital Signs ED Triage Vitals  Enc Vitals Group     BP 07/23/16 1100 134/78     Pulse Rate 07/23/16 1100 64     Resp 07/23/16 1100 18     Temp 07/23/16 1100 97.7 F (36.5 C)     Temp Source 07/23/16 1100 Tympanic     SpO2 07/23/16 1100 97 %     Weight 07/23/16 1101 205 lb (93 kg)     Height 07/23/16 1101 5\' 7"  (1.702 m)     Head Circumference --      Peak Flow --      Pain Score 07/23/16 1104 0     Pain Loc --      Pain Edu? --      Excl. in GC? --    No data found.   Updated Vital Signs BP 134/78 (BP Location: Left Arm)   Pulse 64   Temp 97.7 F (36.5 C) (Tympanic)   Resp 18   Ht 5\' 7"  (1.702 m)   Wt 205 lb (93 kg)   SpO2 97%   BMI 32.11 kg/m   Visual Acuity Right Eye Distance:   Left Eye Distance:   Bilateral Distance:    Right Eye Near:   Left Eye Near:    Bilateral Near:     Physical Exam  Constitutional: He appears well-developed and well-nourished. No distress.  HENT:  Head: Normocephalic and atraumatic.  Right Ear: Tympanic membrane, external ear and ear canal normal.  Left Ear: Tympanic membrane, external ear and ear canal normal.  Nose: Right sinus exhibits maxillary sinus tenderness and frontal sinus tenderness. Left sinus exhibits maxillary sinus tenderness and frontal sinus tenderness.  Mouth/Throat: Uvula is midline, oropharynx is clear and moist and mucous membranes are normal. No oropharyngeal exudate or tonsillar abscesses.  Eyes:  Conjunctivae and EOM are normal. Pupils are equal, round, and reactive to light. Right eye exhibits no discharge. Left eye exhibits no discharge. No scleral icterus.  Neck: Normal range of motion. Neck supple. No tracheal deviation present. No thyromegaly present.  Cardiovascular: Normal rate, regular rhythm and normal heart sounds.   Pulmonary/Chest: Effort normal and breath sounds normal. No stridor. No respiratory distress. He has no wheezes. He has no rales. He exhibits no tenderness.  Lymphadenopathy:    He has no cervical adenopathy.  Neurological: He is alert.  Skin: Skin is warm and dry. No rash noted. He is not diaphoretic.  Nursing note and vitals reviewed.    UC Treatments / Results  Labs (all labs ordered are listed, but only abnormal results are displayed) Labs Reviewed - No data to display  EKG  EKG Interpretation None       Radiology No results found.  Procedures Procedures (including critical care time)  Medications Ordered in UC Medications - No data to display   Initial Impression / Assessment and Plan / UC Course  I have reviewed the triage vital signs and the nursing notes.  Pertinent labs & imaging results that were available during my care of the patient were reviewed by me and considered in my medical decision making (see chart for details).  Clinical Course       Final Clinical Impressions(s) / UC Diagnoses   Final diagnoses:  Acute maxillary sinusitis, recurrence not specified  Bilateral acute serous otitis media, recurrence not specified    New Prescriptions Discharge Medication List as of 07/23/2016 11:49 AM    START taking these medications   Details  amoxicillin (AMOXIL) 875 MG tablet Take 1 tablet (875 mg total) by mouth 2 (two) times daily., Starting Mon 07/23/2016, Normal    benzonatate (TESSALON) 100 MG capsule Take 1 capsule (100 mg total) by mouth every 8 (eight) hours., Starting Mon 07/23/2016, Normal       1. diagnosis  reviewed with patient 2. rx as per orders above; reviewed possible side effects, interactions, risks and benefits  3. Follow-up prn if symptoms worsen or don't improve   Payton Mccallum, MD 07/23/16 1215

## 2016-08-15 ENCOUNTER — Encounter: Payer: Self-pay | Admitting: Emergency Medicine

## 2016-08-15 ENCOUNTER — Ambulatory Visit
Admission: EM | Admit: 2016-08-15 | Discharge: 2016-08-15 | Disposition: A | Discharge status: Home / Self Care | Payer: Medicare Other | Attending: Internal Medicine | Admitting: Internal Medicine

## 2016-08-15 ENCOUNTER — Ambulatory Visit: Payer: Medicare Other

## 2016-08-15 DIAGNOSIS — Z9889 Other specified postprocedural states: Secondary | ICD-10-CM | POA: Insufficient documentation

## 2016-08-15 DIAGNOSIS — Z955 Presence of coronary angioplasty implant and graft: Secondary | ICD-10-CM | POA: Diagnosis not present

## 2016-08-15 DIAGNOSIS — Z7982 Long term (current) use of aspirin: Secondary | ICD-10-CM | POA: Insufficient documentation

## 2016-08-15 DIAGNOSIS — I2510 Atherosclerotic heart disease of native coronary artery without angina pectoris: Secondary | ICD-10-CM | POA: Diagnosis not present

## 2016-08-15 DIAGNOSIS — I4891 Unspecified atrial fibrillation: Secondary | ICD-10-CM | POA: Diagnosis not present

## 2016-08-15 DIAGNOSIS — I4892 Unspecified atrial flutter: Secondary | ICD-10-CM | POA: Insufficient documentation

## 2016-08-15 DIAGNOSIS — Z79899 Other long term (current) drug therapy: Secondary | ICD-10-CM | POA: Diagnosis not present

## 2016-08-15 DIAGNOSIS — R079 Chest pain, unspecified: Secondary | ICD-10-CM | POA: Diagnosis not present

## 2016-08-15 DIAGNOSIS — J449 Chronic obstructive pulmonary disease, unspecified: Secondary | ICD-10-CM | POA: Diagnosis not present

## 2016-08-15 DIAGNOSIS — R531 Weakness: Secondary | ICD-10-CM | POA: Diagnosis present

## 2016-08-15 DIAGNOSIS — R002 Palpitations: Secondary | ICD-10-CM | POA: Diagnosis not present

## 2016-08-15 DIAGNOSIS — Z7901 Long term (current) use of anticoagulants: Secondary | ICD-10-CM | POA: Diagnosis not present

## 2016-08-15 DIAGNOSIS — Z888 Allergy status to other drugs, medicaments and biological substances status: Secondary | ICD-10-CM | POA: Diagnosis not present

## 2016-08-15 DIAGNOSIS — Z885 Allergy status to narcotic agent status: Secondary | ICD-10-CM | POA: Diagnosis not present

## 2016-08-15 DIAGNOSIS — Z8249 Family history of ischemic heart disease and other diseases of the circulatory system: Secondary | ICD-10-CM | POA: Diagnosis not present

## 2016-08-15 MED ORDER — ASPIRIN 81 MG PO CHEW
324.0000 mg | CHEWABLE_TABLET | Freq: Once | ORAL | Status: AC
  Administered 2016-08-15: 324 mg via ORAL

## 2016-08-15 MED ORDER — NITROGLYCERIN 2 % TD OINT
0.5000 [in_us] | TOPICAL_OINTMENT | Freq: Once | TRANSDERMAL | Status: AC
  Administered 2016-08-15: 0.5 [in_us] via TOPICAL

## 2016-08-15 MED ORDER — NITROGLYCERIN 0.4 MG SL SUBL: 0.4000 mg | SUBLINGUAL_TABLET | SUBLINGUAL | 1 refills | Status: DC | PRN

## 2016-08-15 NOTE — ED Triage Notes (Signed)
Pt hx of recurrent a-fib. C/o weakness and "tired in my chest" x3-5 days. Denies dyspnea or diaphoresis, also denies chest pain.

## 2016-08-15 NOTE — ED Provider Notes (Signed)
TRH1  [facility]    CSN: [id number] Arrival date & time: 08/15/16  1426     History   Chief Complaint Chief Complaint  Patient presents with  . Weakness    HPI Keith Estes is a 82 y.o. male. He presents today with a long history of palpitations, used to be able to manage these by putting his head down between his knees. Diagnosed with A. fib and put on tikasyn in 2010; had 5 cardiac stents placed that year as well. Did well until several months ago, was changed to amiodarone about 4 months ago. 3 weeks ago went for an atrial fibrillation checkup, and EKG demonstrated that he was still in A. fib. A follow-up EKG 1 week later demonstrated sinus rhythm. Patient acknowledges that he has been a little worried about this, and he wonders if his anxiety about it is aggravating his condition. 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  He has been drinking some caffeinated beverages, tea and Pepsi, for the holidays as well. He presents to the urgent care with discomfort in his chest, poorly characterized, that he rates as a 3 or 4 out of 10. EKG demonstrates slow a flutter without acute ST or T-wave changes.   HPI  Past Medical History:  Diagnosis Date  . COPD (chronic obstructive pulmonary disease) (HCC)   . Coronary artery disease 2012   LHC: LM 30%, mLAD 50%, D2 40%, OM1 70%, LCX 20% ISR, 50% RCA; patient reports no intervention performed    Patient Active Problem List   Diagnosis Date Noted  . Chest pain 04/29/2016    Past Surgical History:  Procedure Laterality Date  . APPENDECTOMY    . CARDIOVASCULAR STRESS TEST  2014   [facility]: NM: Stress nuclear: No evidence of ischemia, EF: 58% - No significant change from 2011  . HERNIA REPAIR         Home Medications    Prior to Admission medications   Medication Sig Start  Date End Date Taking? Authorizing Provider  albuterol (PROVENTIL) (2.5 MG/3ML) 0.083% nebulizer solution Take 2.5 mg by nebulization every 6 (six) hours as needed for wheezing or shortness of breath.    Historical Provider, MD  ALPRAZolam Prudy Feeler(XANAX) 0.5 MG tablet Take 0.5 mg by mouth at bedtime as needed for anxiety.    Historical Provider, MD  amiodarone (PACERONE) 400 MG tablet Take 1 tablet (400 mg total) by mouth daily. Patient taking differently: Take 200 mg by mouth daily.  05/01/16   Enid Baasadhika Kalisetti, MD  amoxicillin (AMOXIL) 875 MG tablet Take 1 tablet (875 mg total) by mouth 2 (two) times daily. 07/23/16   Payton Mccallumrlando Conty, MD  aspirin 81 MG tablet Take 81 mg by mouth daily. Also Took 162mg  at 1730 06/08/2016    Historical Provider, MD  B Complex-C (B-COMPLEX WITH VITAMIN C) tablet Take 1 tablet by mouth daily.    Historical Provider, MD  benzonatate (TESSALON) 100 MG capsule Take 1 capsule (100 mg total) by mouth every 8 (eight) hours. 07/23/16   Payton Mccallumrlando Conty, MD  carvedilol (COREG) 12.5 MG tablet Take 12.5 mg by mouth 2 (two) times daily with a meal.    Historical Provider, MD  Cholecalciferol 10000 units TABS Take by mouth.    Historical Provider, MD  dabigatran (PRADAXA) 150 MG CAPS capsule Take 150 mg by mouth 2 (two) times daily.  Historical Provider, MD  donepezil (ARICEPT) 10 MG tablet Take 10 mg by mouth at bedtime.    Historical Provider, MD  fluticasone (FLONASE) 50 MCG/ACT nasal spray Place 1 spray into both nostrils daily.    Historical Provider, MD  nitroGLYCERIN (NITROSTAT) 0.4 MG SL tablet Place 0.4 mg under the tongue every 5 (five) minutes as needed for chest pain.    Historical Provider, MD  nitroGLYCERIN (NITROSTAT) 0.4 MG SL tablet Place 1 tablet (0.4 mg total) under the tongue every 5 (five) minutes as needed for chest pain. 08/15/16   Eustace MooreLaura W Terrilee Dudzik, MD  omeprazole (PRILOSEC) 40 MG capsule Take 40 mg by mouth daily.    Historical Provider, MD  Propylene Glycol (SYSTANE  BALANCE) 0.6 % SOLN Apply 1 drop to eye daily.     Historical Provider, MD  ranitidine (ZANTAC) 150 MG tablet Take 150 mg by mouth 2 (two) times daily.    Historical Provider, MD  ranolazine (RANEXA) 500 MG 12 hr tablet Take 500 mg by mouth 2 (two) times daily.    Historical Provider, MD  tamsulosin (FLOMAX) 0.4 MG CAPS capsule Take 0.4 mg by mouth.    Historical Provider, MD  traZODone (DESYREL) 50 MG tablet Take 1 tablet by mouth at bedtime. 05/08/16   Historical Provider, MD    Family History Family History  Problem Relation Age of Onset  . Heart disease Brother     Social History Social History  Substance Use Topics  . Smoking status: Never Smoker  . Smokeless tobacco: Never Used  . Alcohol use No     Allergies   Diltiazem; Hydrocodone; Lipitor [atorvastatin]; Lisinopril; Percodan [oxycodone-aspirin]; and Vicodin [hydrocodone-acetaminophen]   Review of Systems Review of Systems  All other systems reviewed and are negative.    Physical Exam Triage Vital Signs ED Triage Vitals [08/15/16 1439]  Enc Vitals Group     BP (!) 140/119     Pulse Rate 74     Resp 16     Temp 97.8 F (36.6 C)     Temp Source Oral     SpO2 99 %     Weight      Height      Pain Score    Updated Vital Signs BP (!) 140/119 (BP Location: Left Arm)   Pulse 74   Temp 97.8 F (36.6 C) (Oral)   Resp 16   SpO2 99%  Physical Exam  Constitutional: He is oriented to person, place, and time. No distress.  Alert, nicely groomed  HENT:  Head: Atraumatic.  Bilateral TMs quite congested, left TM is pink flushed Marked nasal congestion bilaterally Throat is slightly red  Eyes:  Conjugate gaze, no eye redness/drainage  Neck: Neck supple.  Cardiovascular: Normal rate.   Irregular rhythm  Pulmonary/Chest: No respiratory distress. He has no wheezes. He has no rales.  Lungs clear, symmetric breath sounds  Abdominal: Soft. He exhibits no distension. There is no tenderness. There is no rebound and  no guarding.  Musculoskeletal: Normal range of motion.  Trace bilateral LE edema  Neurological: He is alert and oriented to person, place, and time.  Skin: Skin is warm.  Pink, no cyanosis Skin faintly damp  Nursing note and vitals reviewed.    UC Treatments / Results   EKG Slow a flutter, HR about 70.  No acute ST or T wave changes.     Radiology Dg Chest 2 View  Result Date: 08/15/2016 CLINICAL DATA:  Weakness. EXAM: CHEST  2  VIEW COMPARISON:  Radiographs of June 08, 2016. FINDINGS: The heart size and mediastinal contours are within normal limits. Both lungs are clear. No pneumothorax or pleural effusion is noted. The visualized skeletal structures are unremarkable. IMPRESSION: No active cardiopulmonary disease. Electronically Signed   By: Lupita Raider, M.D.   On: 08/15/2016 15:38    Procedures Procedures (including critical care time)  Medications Ordered in UC Medications  nitroGLYCERIN (NITROGLYN) 2 % ointment 0.5 inch (0.5 inches Topical Given 08/15/16 1521)  aspirin chewable tablet 324 mg (324 mg Oral Given 08/15/16 1520)     Final Clinical Impressions(s) / UC Diagnoses   Final diagnoses:  Atrial fibrillation and flutter (HCC)  Coronary artery disease involving native coronary artery of native heart, angina presence unspecified   Increased episodes of atrial fibrillation with new exercise intolerance for previously tolerated work (stoking wood stove) could be a sign of worsening heart disease (blockage).  Please see Dr Gwen Pounds at [facility], [facility] office, at [facility] tomorrow 08/16/16 to discuss further evaluation.  If you have discomfort in your chest at rest before then, that does not go away with 1 or 2 nitroglycerin tablets, please go to the emergency room for further evaluation.   New Prescriptions Discharge Medication List as of 08/15/2016  4:04 PM    START taking these medications   Details  !! nitroGLYCERIN (NITROSTAT) 0.4 MG SL  tablet Place 1 tablet (0.4 mg total) under the tongue every 5 (five) minutes as needed for chest pain., Starting Wed 08/15/2016, Normal       Eustace Moore, MD 08/15/16 2105

## 2016-08-15 NOTE — Discharge Instructions (Addendum)
Increased episodes of atrial fibrillation with new exercise intolerance for previously tolerated work (stoking Teacher, adult educationwood stove) could be a sign of worsening heart disease (blockage).  [facility], at [facility] tomorrow 08/16/16 to discuss further evaluation.  If you have discomfort in your chest at rest before then, that does not go away with 1 or 2 nitroglycerin tablets, please go to the emergency room for further evaluation.

## 2016-09-19 ENCOUNTER — Ambulatory Visit: Payer: Medicare Other | Admitting: Anesthesiology

## 2016-09-19 ENCOUNTER — Encounter
Admission: RE | Disposition: A | Discharge status: Home / Self Care | Payer: Self-pay | Source: Ambulatory Visit | Attending: Internal Medicine

## 2016-09-19 ENCOUNTER — Ambulatory Visit
Admission: RE | Admit: 2016-09-19 | Discharge: 2016-09-19 | Disposition: A | Discharge status: Home / Self Care | Payer: Medicare Other | Source: Ambulatory Visit | Attending: Internal Medicine | Admitting: Internal Medicine

## 2016-09-19 ENCOUNTER — Encounter: Payer: Self-pay | Admitting: Anesthesiology

## 2016-09-19 DIAGNOSIS — Z8249 Family history of ischemic heart disease and other diseases of the circulatory system: Secondary | ICD-10-CM | POA: Diagnosis not present

## 2016-09-19 DIAGNOSIS — Z9989 Dependence on other enabling machines and devices: Secondary | ICD-10-CM | POA: Insufficient documentation

## 2016-09-19 DIAGNOSIS — J449 Chronic obstructive pulmonary disease, unspecified: Secondary | ICD-10-CM | POA: Diagnosis not present

## 2016-09-19 DIAGNOSIS — I2510 Atherosclerotic heart disease of native coronary artery without angina pectoris: Secondary | ICD-10-CM | POA: Insufficient documentation

## 2016-09-19 DIAGNOSIS — Z7982 Long term (current) use of aspirin: Secondary | ICD-10-CM | POA: Diagnosis not present

## 2016-09-19 DIAGNOSIS — I10 Essential (primary) hypertension: Secondary | ICD-10-CM | POA: Insufficient documentation

## 2016-09-19 DIAGNOSIS — Z79899 Other long term (current) drug therapy: Secondary | ICD-10-CM | POA: Diagnosis not present

## 2016-09-19 DIAGNOSIS — G4730 Sleep apnea, unspecified: Secondary | ICD-10-CM | POA: Insufficient documentation

## 2016-09-19 DIAGNOSIS — Z7901 Long term (current) use of anticoagulants: Secondary | ICD-10-CM | POA: Diagnosis not present

## 2016-09-19 DIAGNOSIS — I480 Paroxysmal atrial fibrillation: Secondary | ICD-10-CM | POA: Diagnosis not present

## 2016-09-19 HISTORY — PX: ELECTROPHYSIOLOGIC STUDY: SHX172A

## 2016-09-19 SURGERY — CARDIOVERSION (CATH LAB)
Anesthesia: General

## 2016-09-19 MED ORDER — PROPOFOL 10 MG/ML IV BOLUS
INTRAVENOUS | Status: DC | PRN
  Administered 2016-09-19: 20 mg via INTRAVENOUS
  Administered 2016-09-19: 50 mg via INTRAVENOUS

## 2016-09-19 MED ORDER — SODIUM CHLORIDE 0.9 % IV SOLN
INTRAVENOUS | Status: DC
  Administered 2016-09-19: 07:00:00 via INTRAVENOUS

## 2016-09-19 MED ORDER — EPHEDRINE 5 MG/ML INJ
INTRAVENOUS | Status: AC
  Filled 2016-09-19: qty 10

## 2016-09-19 MED ORDER — PROPOFOL 10 MG/ML IV BOLUS
INTRAVENOUS | Status: AC
  Filled 2016-09-19: qty 40

## 2016-09-19 MED ORDER — PHENYLEPHRINE HCL 10 MG/ML IJ SOLN
INTRAMUSCULAR | Status: AC
  Filled 2016-09-19: qty 1

## 2016-09-19 NOTE — Anesthesia Post-op Follow-up Note (Cosign Needed)
Anesthesia QCDR form completed.        

## 2016-09-19 NOTE — CV Procedure (Signed)
Electrical Cardioversion Procedure Note Keith Estes [id number] 06/23/1929  Procedure: Electrical Cardioversion Indications:  Atrial Fibrillation  Procedure Details Consent: Risks of procedure as well as the alternatives and risks of each were explained to the (patient/caregiver).  Consent for procedure obtained. Time Out: Verified patient identification, verified procedure, site/side was marked, verified correct patient position, special equipment/implants available, medications/allergies/relevent history reviewed, required imaging and test results available.  Performed  Patient placed on cardiac monitor, pulse oximetry, supplemental oxygen as necessary.  Sedation given: Benzodiazepines and Short-acting barbiturates Pacer pads placed anterior and posterior chest.  Cardioverted 1 time(s).  Cardioverted at 120J.  Evaluation Findings: Post procedure EKG shows: NSR Complications: None Patient did tolerate procedure well.   Keith BlinksBruce J Merton Estes 09/19/2016, 7:46 AM

## 2016-09-19 NOTE — Anesthesia Preprocedure Evaluation (Addendum)
Anesthesia Evaluation  Patient identified by MRN, date of birth, ID band Patient awake    Reviewed: Allergy & Precautions, NPO status , Patient's Chart, lab work & pertinent test results, reviewed documented beta blocker date and time   Airway Mallampati: III  TM Distance: >3 FB     Dental  (+) Chipped, Poor Dentition, Dental Advisory Given   Pulmonary COPD,           Cardiovascular + CAD  + dysrhythmias Atrial Fibrillation      Neuro/Psych    GI/Hepatic   Endo/Other    Renal/GU      Musculoskeletal   Abdominal   Peds  Hematology   Anesthesia Other Findings   Reproductive/Obstetrics                            Anesthesia Physical Anesthesia Plan  ASA: III  Anesthesia Plan: General   Post-op Pain Management:    Induction: Intravenous  Airway Management Planned: Nasal Cannula  Additional Equipment:   Intra-op Plan:   Post-operative Plan:   Informed Consent: I have reviewed the patients History and Physical, chart, labs and discussed the procedure including the risks, benefits and alternatives for the proposed anesthesia with the patient or authorized representative who has indicated his/her understanding and acceptance.     Plan Discussed with: CRNA  Anesthesia Plan Comments:         Anesthesia Quick Evaluation

## 2016-09-19 NOTE — Transfer of Care (Signed)
Immediate Anesthesia Transfer of Care Note  Patient: Keith Estes  Procedure(s) Performed: Procedure(s): CARDIOVERSION (N/A)  Patient Location: PACU  Anesthesia Type:General  Level of Consciousness: sedated  Airway & Oxygen Therapy: Patient Spontanous Breathing and Patient connected to nasal cannula oxygen  Post-op Assessment: Report given to RN and Post -op Vital signs reviewed and stable  Post vital signs: Reviewed and stable  Last Vitals:  Vitals:   09/19/16 0748 09/19/16 0749  BP:  121/75  Pulse: (!) 54 (!) 53  Resp: 19   Temp:      Last Pain:  Vitals:   09/19/16 0719  TempSrc: Oral         Complications: No apparent anesthesia complications

## 2016-09-19 NOTE — Anesthesia Procedure Notes (Signed)
Date/Time: 09/19/2016 7:35 AM Performed by: Keith CaldwellSTARR, Keith Estes Pre-anesthesia Checklist: Patient identified, Emergency Drugs available, Suction available and Patient being monitored Patient Re-evaluated:Patient Re-evaluated prior to inductionOxygen Delivery Method: Nasal cannula

## 2016-09-19 NOTE — Anesthesia Postprocedure Evaluation (Signed)
Anesthesia Post Note  Patient: Keith Estes  Procedure(s) Performed: Procedure(s) (LRB): CARDIOVERSION (N/A)  Patient location during evaluation: Other Anesthesia Type: General Level of consciousness: awake and alert Pain management: pain level controlled Vital Signs Assessment: post-procedure vital signs reviewed and stable Respiratory status: spontaneous breathing, nonlabored ventilation, respiratory function stable and patient connected to nasal cannula oxygen Cardiovascular status: blood pressure returned to baseline and stable Postop Assessment: no signs of nausea or vomiting Anesthetic complications: no     Last Vitals:  Vitals:   09/19/16 0815 09/19/16 0824  BP: 112/78 128/86  Pulse: (!) 57 (!) 55  Resp: 13 12  Temp:      Last Pain:  Vitals:   09/19/16 0719  TempSrc: Oral                 [name] S

## 2017-02-11 ENCOUNTER — Encounter: Payer: Self-pay | Admitting: *Deleted

## 2017-02-11 ENCOUNTER — Ambulatory Visit
Admission: EM | Admit: 2017-02-11 | Discharge: 2017-02-11 | Disposition: A | Discharge status: Home / Self Care | Payer: Medicare Other | Attending: Family Medicine | Admitting: Family Medicine

## 2017-02-11 DIAGNOSIS — R05 Cough: Secondary | ICD-10-CM

## 2017-02-11 DIAGNOSIS — J441 Chronic obstructive pulmonary disease with (acute) exacerbation: Secondary | ICD-10-CM | POA: Diagnosis not present

## 2017-02-11 MED ORDER — PREDNISONE 10 MG (21) PO TBPK: ORAL_TABLET | ORAL | 0 refills | Status: DC

## 2017-02-11 MED ORDER — AMOXICILLIN-POT CLAVULANATE 875-125 MG PO TABS: 1.0000 | ORAL_TABLET | Freq: Two times a day (BID) | ORAL | 0 refills | Status: DC

## 2017-02-11 MED ORDER — BENZONATATE 100 MG PO CAPS: 100.0000 mg | ORAL_CAPSULE | Freq: Three times a day (TID) | ORAL | 0 refills | Status: DC

## 2017-02-11 NOTE — ED Triage Notes (Signed)
Productive cough- brown/yellow, chest congestion, dyspnea, x3 days. Denies fever.

## 2017-02-11 NOTE — ED Provider Notes (Signed)
[facility]    CSN: [id number] Arrival date & time: 02/11/17  1120     History   Chief Complaint Chief Complaint  Patient presents with  . Cough  . Nasal Congestion    HPI Keith Estes is a 83 y.o. male.   Patient is a 83 year old white male who is on Aricept. According to him and his wife he has a diagnosis of COPD exposure to years of working around dust and fumes. There is some discrepancy in the history but apparently last week Wednesday he started having a running nose and nasal congestion. According to his wife the breathing trouble started about Saturday Sunday he states that he was having wheezing trouble even on Wednesday. Irregardless he does have a history of COPD. Unfortunately is difficult to get a clear history whether he is having yellowish production now with his coughing or when he had yellow production before. He definitely doesn't feel that well right now and according to his wife the coughing and congestion has gotten worse since yesterday. Along with COPD he has coronary artery disease. He's had appendectomy coronary artery test he is allergic to hydrocodone Percodan and codeine lisinopril and Cardizem. They do not think he is allergic to any antibiotics. He never smoked before the past. Family medical history is really not pertinent to today's visit.   The history is provided by the patient and the spouse.  Cough  Cough characteristics:  Productive Sputum characteristics:  Yellow Severity:  Moderate Progression:  Worsening Chronicity:  New Context: upper respiratory infection   Context: not smoke exposure   Relieved by:  Nothing Worsened by:  Nothing Associated symptoms: wheezing     Past Medical History:  Diagnosis Date  . COPD (chronic obstructive pulmonary disease) (HCC)   . Coronary artery disease 2012   LHC: LM 30%, mLAD 50%, D2 40%, OM1 70%, LCX 20% ISR, 50% RCA; patient reports no intervention performed    Patient Active  Problem List   Diagnosis Date Noted  . Chest pain 04/29/2016    Past Surgical History:  Procedure Laterality Date  . APPENDECTOMY    . CARDIOVASCULAR STRESS TEST  2014   [facility]: NM: Stress nuclear: No evidence of ischemia, EF: 58% - No significant change from 2011  . ELECTROPHYSIOLOGIC STUDY N/A 09/19/2016   Procedure: CARDIOVERSION;  Surgeon: Lamar Blinks, MD;  Location: [facility] ORS;  Service: Cardiovascular;  Laterality: N/A;  . HERNIA REPAIR         Home Medications    Prior to Admission medications   Medication Sig Start Date End Date Taking? Authorizing Provider  albuterol (PROVENTIL) (2.5 MG/3ML) 0.083% nebulizer solution Take 2.5 mg by nebulization every 6 (six) hours as needed for wheezing or shortness of breath.   Yes [provider]  amiodarone (PACERONE) 200 MG tablet Take 200 mg by mouth 2 (two) times daily. 09/01/16  Yes [provider]  B Complex-C (B-COMPLEX WITH VITAMIN C) tablet Take 1 tablet by mouth daily.   Yes [provider]  calcium carbonate (CALCIUM 600) 600 MG TABS tablet Take 600 mg by mouth daily.    Yes [provider]  carvedilol (COREG) 12.5 MG tablet Take 12.5 mg by mouth 2 (two) times daily with a meal.   Yes [provider]  dabigatran (PRADAXA) 150 MG CAPS capsule Take 150 mg by mouth 2 (two) times daily.   Yes [provider]  donepezil (ARICEPT) 10 MG tablet Take 10 mg by mouth at  bedtime.   Yes [provider]  fluticasone (FLONASE) 50 MCG/ACT nasal spray Place 1 spray into both nostrils daily.   Yes [provider]  Multiple Vitamins-Minerals (PRESERVISION AREDS 2 PO) Take 1 tablet by mouth 2 (two) times daily.   Yes [provider]  omeprazole (PRILOSEC) 40 MG capsule Take 40 mg by mouth daily with supper.    Yes [provider]  ranitidine (ZANTAC) 150 MG tablet Take 150 mg by mouth 2 (two) times daily.   Yes [provider]  tamsulosin (FLOMAX) 0.4  MG CAPS capsule Take 0.4 mg by mouth daily after supper.    Yes [provider]  traZODone (DESYREL) 50 MG tablet Take 50 mg by mouth at bedtime.  05/08/16  Yes [provider]  amoxicillin-clavulanate (AUGMENTIN) 875-125 MG tablet Take 1 tablet by mouth 2 (two) times daily. 02/11/17   Hassan Rowan, MD  benzonatate (TESSALON) 100 MG capsule Take 1 capsule (100 mg total) by mouth every 8 (eight) hours. Patient not taking: Reported on 09/14/2016 07/23/16   Payton Mccallum, MD  benzonatate (TESSALON) 100 MG capsule Take 1 capsule (100 mg total) by mouth every 8 (eight) hours. 02/11/17   Hassan Rowan, MD  chlorhexidine (PERIDEX) 0.12 % solution Use as directed 15 mLs in the mouth or throat 2 (two) times daily. 09/04/16   [provider]  Cholecalciferol 10000 units TABS Take 1,000 Units by mouth daily.     [provider]  nitroGLYCERIN (NITROSTAT) 0.4 MG SL tablet Place 1 tablet (0.4 mg total) under the tongue every 5 (five) minutes as needed for chest pain. 08/15/16   Eustace Moore, MD  predniSONE (STERAPRED UNI-PAK 21 TAB) 10 MG (21) TBPK tablet Sig 6 tablet day 1, 5 tablets day 2, 4 tablets day 3,,3tablets day 4, 2 tablets day 5, 1 tablet day 6 take all tablets orally 02/11/17   Hassan Rowan, MD  Propylene Glycol (SYSTANE BALANCE) 0.6 % SOLN Apply 1 drop to eye daily.     [provider]    Family History Family History  Problem Relation Age of Onset  . Heart disease Brother     Social History Social History  Substance Use Topics  . Smoking status: Never Smoker  . Smokeless tobacco: Never Used  . Alcohol use No     Allergies   Diltiazem; Hydrocodone; Lipitor [atorvastatin]; Lisinopril; Percodan [oxycodone-aspirin]; and Vicodin [hydrocodone-acetaminophen]   Review of Systems Review of Systems  HENT: Positive for sinus pain and sinus pressure.   Respiratory: Positive for cough and wheezing.   All other systems reviewed and are  negative.    Physical Exam Triage Vital Signs ED Triage Vitals  Enc Vitals Group     BP 02/11/17 1207 (!) 161/91     Pulse Rate 02/11/17 1207 61     Resp 02/11/17 1207 16     Temp 02/11/17 1207 97.8 F (36.6 C)     Temp Source 02/11/17 1207 Oral     SpO2 02/11/17 1207 97 %     Weight 02/11/17 1209 205 lb (93 kg)     Height 02/11/17 1209 5\' 7"  (1.702 m)     Head Circumference --      Peak Flow --      Pain Score --      Pain Loc --      Pain Edu? --      Excl. in GC? --    No data found.   Updated Vital Signs  BP (!) 161/91 (BP Location: Left Arm)   Pulse 61   Temp 97.8 F (36.6 C) (Oral)   Resp 16   Ht 5\' 7"  (1.702 m)   Wt 205 lb (93 kg)   SpO2 97%   BMI 32.11 kg/m   Visual Acuity Right Eye Distance:   Left Eye Distance:   Bilateral Distance:    Right Eye Near:   Left Eye Near:    Bilateral Near:     Physical Exam  Constitutional: He appears well-developed.  Non-toxic appearance. He does not have a sickly appearance. He does not appear ill. No distress.  Elderly white male no acute distress  Eyes: Conjunctivae are normal. Pupils are equal, round, and reactive to light.  Neck: Normal range of motion. Neck supple.  Cardiovascular: Normal rate.  Exam reveals distant heart sounds.   Pulmonary/Chest: He has decreased breath sounds. He has no wheezes. He has no rhonchi.  Musculoskeletal: Normal range of motion.  Lymphadenopathy:    He has no cervical adenopathy.  Neurological: He is alert.  Skin: Skin is warm.  Psychiatric: He has a normal mood and affect.  Vitals reviewed.    UC Treatments / Results  Labs (all labs ordered are listed, but only abnormal results are displayed) Labs Reviewed - No data to display  EKG  EKG Interpretation None       Radiology No results found.  Procedures Procedures (including critical care time)  Medications Ordered in UC Medications - No data to display   Initial Impression / Assessment and Plan / UC  Course  I have reviewed the triage vital signs and the nursing notes.  Pertinent labs & imaging results that were available during my care of the patient were reviewed by me and considered in my medical decision making (see chart for details).    patient will be placed on Augmentin 875 one tablet twice a day will also place on prednisone for 6 date dose pack and Tessalon Perles 100 mg 3 times a day for coughing on a when necessary basis follow-up with PCP as needed.    Final Clinical Impressions(s) / UC Diagnoses   Final diagnoses:  COPD with acute exacerbation (HCC)    New Prescriptions New Prescriptions   AMOXICILLIN-CLAVULANATE (AUGMENTIN) 875-125 MG TABLET    Take 1 tablet by mouth 2 (two) times daily.   BENZONATATE (TESSALON) 100 MG CAPSULE    Take 1 capsule (100 mg total) by mouth every 8 (eight) hours.   PREDNISONE (STERAPRED UNI-PAK 21 TAB) 10 MG (21) TBPK TABLET    Sig 6 tablet day 1, 5 tablets day 2, 4 tablets day 3,,3tablets day 4, 2 tablets day 5, 1 tablet day 6 take all tablets orally     Note: This dictation was prepared with Dragon dictation along with smaller phrase technology. Any transcriptional errors that result from this process are unintentional.   Hassan RowanWade, Quida Glasser, MD 02/11/17 1246

## 2017-03-12 ENCOUNTER — Ambulatory Visit
Admission: EM | Admit: 2017-03-12 | Discharge: 2017-03-12 | Disposition: A | Discharge status: Home / Self Care | Payer: Medicare Other | Attending: Family Medicine | Admitting: Family Medicine

## 2017-03-12 DIAGNOSIS — K5903 Drug induced constipation: Secondary | ICD-10-CM | POA: Diagnosis not present

## 2017-03-12 DIAGNOSIS — K4090 Unilateral inguinal hernia, without obstruction or gangrene, not specified as recurrent: Secondary | ICD-10-CM

## 2017-03-12 NOTE — ED Provider Notes (Addendum)
CSN: [id number]     Arrival date & time 03/12/17  0907 History   None    Chief Complaint  Patient presents with  . Hernia   (Consider location/radiation/quality/duration/timing/severity/associated sxs/prior Treatment) HPI  This is an 83 year old male accompanied by his wife who presents with right lower abdominal/groin pain. Patient states whenever he sits he is very comfortable but anytime he standing or particularly with lifting the pain becomes very severe. It is sharply localized to one small area. Patient has had a left herniorrhaphy with mesh previously. He states that this seems very similar. His had no nausea vomiting he's had no abdominal pain other than the area where the pain is localized and has not had any fever or chills. He states that the pain has been present for about a month. Initially started after he had a severe cold one month ago. Since that time it has worsened. He has noticed it more severe when he was working in his garden and [employer]ting buckets of material.        Past Medical History:  Diagnosis Date  . COPD (chronic obstructive pulmonary disease) (HCC)   . Coronary artery disease 2012   LHC: LM 30%, mLAD 50%, D2 40%, OM1 70%, LCX 20% ISR, 50% RCA; patient reports no intervention performed   Past Surgical History:  Procedure Laterality Date  . APPENDECTOMY    . CARDIOVASCULAR STRESS TEST  2014   [facility]: NM: Stress nuclear: No evidence of ischemia, EF: 58% - No significant change from 2011  . ELECTROPHYSIOLOGIC STUDY N/A 09/19/2016   Procedure: CARDIOVERSION;  Surgeon: Lamar BlinksBruce J Kowalski, MD;  Location: [facility] ORS;  Service: Cardiovascular;  Laterality: N/A;  . HERNIA REPAIR     Family History  Problem Relation Age of Onset  . Heart disease Brother    Social History  Substance Use Topics  . Smoking status: Never Smoker  . Smokeless tobacco: Never Used  . Alcohol use No    Review of Systems  Constitutional: Positive for activity change. Negative for  appetite change, chills, fatigue and fever.  Gastrointestinal: Positive for abdominal pain.  All other systems reviewed and are negative.   Allergies  Diltiazem; Hydrocodone; Lipitor [atorvastatin]; Lisinopril; Percodan [oxycodone-aspirin]; and Vicodin [hydrocodone-acetaminophen]  Home Medications   Prior to Admission medications   Medication Sig Start Date End Date Taking? Authorizing Provider  albuterol (PROVENTIL) (2.5 MG/3ML) 0.083% nebulizer solution Take 2.5 mg by nebulization every 6 (six) hours as needed for wheezing or shortness of breath.   Yes [provider]  amiodarone (PACERONE) 200 MG tablet Take 200 mg by mouth 2 (two) times daily. 09/01/16  Yes [provider]  B Complex-C (B-COMPLEX WITH VITAMIN C) tablet Take 1 tablet by mouth daily.   Yes [provider]  calcium carbonate (CALCIUM 600) 600 MG TABS tablet Take 600 mg by mouth daily.    Yes [provider]  carvedilol (COREG) 12.5 MG tablet Take 12.5 mg by mouth 2 (two) times daily with a meal.   Yes [provider]  chlorhexidine (PERIDEX) 0.12 % solution Use as directed 15 mLs in the mouth or throat 2 (two) times daily. 09/04/16  Yes [provider]  Cholecalciferol 10000 units TABS Take 1,000 Units by mouth daily.    Yes [provider]  dabigatran (PRADAXA) 150 MG CAPS capsule Take 150 mg by mouth 2 (two) times daily.   Yes [provider]  donepezil (ARICEPT) 10 MG tablet Take 10 mg by mouth at bedtime.  Yes [provider]  fluticasone (FLONASE) 50 MCG/ACT nasal spray Place 1 spray into both nostrils daily.   Yes [provider]  Multiple Vitamins-Minerals (PRESERVISION AREDS 2 PO) Take 1 tablet by mouth 2 (two) times daily.   Yes [provider]  nitroGLYCERIN (NITROSTAT) 0.4 MG SL tablet Place 1 tablet (0.4 mg total) under the tongue every 5 (five) minutes as needed for chest pain. 08/15/16  Yes Eustace Moore, MD   omeprazole (PRILOSEC) 40 MG capsule Take 40 mg by mouth daily with supper.    Yes [provider]  predniSONE (STERAPRED UNI-PAK 21 TAB) 10 MG (21) TBPK tablet Sig 6 tablet day 1, 5 tablets day 2, 4 tablets day 3,,3tablets day 4, 2 tablets day 5, 1 tablet day 6 take all tablets orally 02/11/17  Yes Hassan Rowan, MD  benzonatate (TESSALON) 100 MG capsule Take 1 capsule (100 mg total) by mouth every 8 (eight) hours. Patient not taking: Reported on 09/14/2016 07/23/16   Payton Mccallum, MD  Propylene Glycol (SYSTANE BALANCE) 0.6 % SOLN Apply 1 drop to eye daily.     [provider]  ranitidine (ZANTAC) 150 MG tablet Take 150 mg by mouth 2 (two) times daily.    [provider]  tamsulosin (FLOMAX) 0.4 MG CAPS capsule Take 0.4 mg by mouth daily after supper.     [provider]  traZODone (DESYREL) 50 MG tablet Take 50 mg by mouth at bedtime.  05/08/16   [provider]   Meds Ordered and Administered this Visit  Medications - No data to display  BP 121/71 (BP Location: Left Arm)   Pulse (!) 52   Temp 97.7 F (36.5 C) (Oral)   Resp 18   Ht 5\' 7"  (1.702 m)   Wt 205 lb (93 kg)   SpO2 100%   BMI 32.11 kg/m  No data found.   Physical Exam  Constitutional: He is oriented to person, place, and time. He appears well-developed and well-nourished. No distress.  HENT:  Head: Normocephalic.  Eyes: Pupils are equal, round, and reactive to light.  Neck: Normal range of motion.  Abdominal: Soft. Bowel sounds are normal. A hernia is present.  Patient has a small direct hernia in the right lower abdomen. He does not have an indirect hernia today by examination.  Musculoskeletal: Normal range of motion.  Neurological: He is alert and oriented to person, place, and time.  Skin: Skin is warm and dry. He is not diaphoretic.  Psychiatric: He has a normal mood and affect. His behavior is normal. Judgment and thought content normal.  Nursing note and vitals  reviewed.   Urgent Care Course     Procedures (including critical care time)  Labs Review Labs Reviewed - No data to display  Imaging Review No results found.   Visual Acuity Review  Right Eye Distance:   Left Eye Distance:   Bilateral Distance:    Right Eye Near:   Left Eye Near:    Bilateral Near:         MDM   1. Right inguinal hernia    Had a discussion with the patient and his wife. I have reviewed hernias specifically in regards to  incarceration and/or strangulation. It appears that he has not had any strangulation issues but certainly appears to be incarcerated by his description. I recommended that they be seen by a surgeon for further evaluation and definitive care. Given him the name of the [facility]. They will  make contact with the group to arrange an appointment in the very near future. If he has any symptoms of strangulation- pain that does not quit ,nausea vomiting he should go immediately to the emergency department.   Lutricia Feil, PA-C 03/12/17 1053    Lutricia Feil, PA-C 03/12/17 1055

## 2017-03-12 NOTE — ED Triage Notes (Signed)
Patient complains of right abdominal/groin pain. Patient states that he has a hernia and has recently been having issues with it. Patient reports that pain was very bad this morning. Patient states that pain is better with just sitting.

## 2017-03-14 ENCOUNTER — Encounter: Payer: Self-pay | Admitting: General Surgery

## 2017-03-14 ENCOUNTER — Telehealth: Payer: Self-pay

## 2017-03-14 ENCOUNTER — Ambulatory Visit (INDEPENDENT_AMBULATORY_CARE_PROVIDER_SITE_OTHER): Payer: Medicare Other | Admitting: General Surgery

## 2017-03-14 VITALS — BP 120/74 | HR 52 | Temp 97.6°F | Ht 67.0 in | Wt 198.8 lb

## 2017-03-14 DIAGNOSIS — K869 Disease of pancreas, unspecified: Secondary | ICD-10-CM | POA: Insufficient documentation

## 2017-03-14 DIAGNOSIS — M1990 Unspecified osteoarthritis, unspecified site: Secondary | ICD-10-CM | POA: Insufficient documentation

## 2017-03-14 DIAGNOSIS — K4090 Unilateral inguinal hernia, without obstruction or gangrene, not specified as recurrent: Secondary | ICD-10-CM | POA: Diagnosis not present

## 2017-03-14 DIAGNOSIS — R7302 Impaired glucose tolerance (oral): Secondary | ICD-10-CM | POA: Insufficient documentation

## 2017-03-14 HISTORY — DX: Unspecified osteoarthritis, unspecified site: M19.90

## 2017-03-14 HISTORY — DX: Impaired glucose tolerance (oral): R73.02

## 2017-03-14 NOTE — Patient Instructions (Addendum)
You have chose to have your hernia repaired. This will be done by Dr. Tonita CongWoodham on 03/26/17 at [facility].  Please see your (blue) Pre-care information that you have been given today.  We will contact Dr. Philemon KingdomKowalski's office in regards to your Cardiac and Anti-coagulant Clearance. I will let you know his reply about your medication as soon as I talk with him.  You will need to arrange to be out of work for 2 weeks and then return with a lifting restrictions for 4 more weeks. Please send any FMLA paperwork prior to surgery and we will fill this out and fax it back to your employer within 3 business days.  You may have a bruise in your groin and also swelling and brusing in your testicle area. You may use ice 4-5 times daily for 15-20 minutes each time. Make sure that you place a barrier between you and the ice pack. To decrease the swelling, you may roll up a bath towel and place it vertically in between your thighs with your testicles resting on the towel. You will want to keep this area elevated as much as possible for several days following surgery.    Inguinal Hernia, Adult Muscles help keep everything in the body in its proper place. But if a weak spot in the muscles develops, something can poke through. That is called a hernia. When this happens in the lower part of the belly (abdomen), it is called an inguinal hernia. (It takes its name from a part of the body in this region called the inguinal canal.) A weak spot in the wall of muscles lets some fat or part of the small intestine bulge through. An inguinal hernia can develop at any age. Men get them more often than women. CAUSES  In adults, an inguinal hernia develops over time.  It can be triggered by:  Suddenly straining the muscles of the lower abdomen.  Lifting heavy objects.  Straining to have a bowel movement. Difficult bowel movements (constipation) can lead to this.  Constant coughing. This may be caused by smoking or lung  disease.  Being overweight.  Being pregnant.  Working at a job that requires long periods of standing or heavy lifting.  Having had an inguinal hernia before. One type can be an emergency situation. It is called a strangulated inguinal hernia. It develops if part of the small intestine slips through the weak spot and cannot get back into the abdomen. The blood supply can be cut off. If that happens, part of the intestine may die. This situation requires emergency surgery. SYMPTOMS  Often, a small inguinal hernia has no symptoms. It is found when a healthcare provider does a physical exam. Larger hernias usually have symptoms.   In adults, symptoms may include:  A lump in the groin. This is easier to see when the person is standing. It might disappear when lying down.  In men, a lump in the scrotum.  Pain or burning in the groin. This occurs especially when lifting, straining or coughing.  A dull ache or feeling of pressure in the groin.  Signs of a strangulated hernia can include:  A bulge in the groin that becomes very painful and tender to the touch.  A bulge that turns red or purple.  Fever, nausea and vomiting.  Inability to have a bowel movement or to pass gas. DIAGNOSIS  To decide if you have an inguinal hernia, a healthcare provider will probably do a physical examination.  This will  include asking questions about any symptoms you have noticed.  The healthcare provider might feel the groin area and ask you to cough. If an inguinal hernia is felt, the healthcare provider may try to slide it back into the abdomen.  Usually no other tests are needed. TREATMENT  Treatments can vary. The size of the hernia makes a difference. Options include:  Watchful waiting. This is often suggested if the hernia is small and you have had no symptoms.  No medical procedure will be done unless symptoms develop.  You will need to watch closely for symptoms. If any occur, contact your  healthcare provider right away.  Surgery. This is used if the hernia is larger or you have symptoms.  Open surgery. This is usually an outpatient procedure (you will not stay overnight in a hospital). An cut (incision) is made through the skin in the groin. The hernia is put back inside the abdomen. The weak area in the muscles is then repaired by herniorrhaphy or hernioplasty. Herniorrhaphy: in this type of surgery, the weak muscles are sewn back together. Hernioplasty: a patch or mesh is used to close the weak area in the abdominal wall.  Laparoscopy. In this procedure, a surgeon makes small incisions. A thin tube with a tiny video camera (called a laparoscope) is put into the abdomen. The surgeon repairs the hernia with mesh by looking with the video camera and using two long instruments. HOME CARE INSTRUCTIONS   After surgery to repair an inguinal hernia:  You will need to take pain medicine prescribed by your healthcare provider. Follow all directions carefully.  You will need to take care of the wound from the incision.  Your activity will be restricted for awhile. This will probably include no heavy lifting for several weeks. You also should not do anything too active for a few weeks. When you can return to work will depend on the type of job that you have.  During "watchful waiting" periods, you should:  Maintain a healthy weight.  Eat a diet high in fiber (fruits, vegetables and whole grains).  Drink plenty of fluids to avoid constipation. This means drinking enough water and other liquids to keep your urine clear or pale yellow.  Do not lift heavy objects.  Do not stand for long periods of time.  Quit smoking. This should keep you from developing a frequent cough. SEEK MEDICAL CARE IF:   A bulge develops in your groin area.  You feel pain, a burning sensation or pressure in the groin. This might be worse if you are lifting or straining.  You develop a fever of more than  100.5 F (38.1 C). SEEK IMMEDIATE MEDICAL CARE IF:   Pain in the groin increases suddenly.  A bulge in the groin gets bigger suddenly and does not go down.  For men, there is sudden pain in the scrotum. Or, the size of the scrotum increases.  A bulge in the groin area becomes red or purple and is painful to touch.  You have nausea or vomiting that does not go away.  You feel your heart beating much faster than normal.  You cannot have a bowel movement or pass gas.  You develop a fever of more than 102.0 F (38.9 C).   This information is not intended to replace advice given to you by your health care provider. Make sure you discuss any questions you have with your health care provider.   Document Released: 12/23/2008 Document Revised: 10/29/2011 Document  Reviewed: 02/07/2015 Elsevier Interactive Patient Education [employer]

## 2017-03-14 NOTE — Telephone Encounter (Signed)
Cardiac and Anti-coagulant Clearance Faxed to Dr. Gwen PoundsKowalski at this time.
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
## 2017-03-18 ENCOUNTER — Telehealth: Payer: Self-pay | Admitting: General Surgery

## 2017-03-18 NOTE — Telephone Encounter (Signed)
Pt advised of pre op date/time and sx date. Sx: 03/26/17 with Dr Sedonia SmallWoodham--open right inguinal hernia repair.  Pre op: 03/21/17 @ 11:30am--Office.   Patient made aware to call [phone], between 1-3:00pm the day before surgery, to find out what time to arrive.

## 2017-03-19 ENCOUNTER — Inpatient Hospital Stay: Admission: RE | Admit: 2017-03-19 | Payer: Medicare Other | Source: Ambulatory Visit

## 2017-03-20 ENCOUNTER — Telehealth: Payer: Self-pay

## 2017-03-20 NOTE — Telephone Encounter (Signed)
Keith Estes (patients daughter) is calling for her father. He is having a lot of problems getting himself in and out of the bed. He is 83 years old and has COPD. Medicare covers a hospital bed and wants to see if the nurse can request one to be sent to him or how it works. The web site states that they need a request from the doctor to do that. Do we do it or does he need to go through his PCP? Please call patient and advice.

## 2017-03-20 NOTE — Telephone Encounter (Signed)
ERROR

## 2017-03-20 NOTE — Telephone Encounter (Signed)
Returned call made to Western & Southern FinancialSherilyn Estes at this time. I informed her that she would have to contact patients' PCP in order to get a request sent in for a hospital bed. She stated that she had done so already and that it had been taken care of.

## 2017-03-21 ENCOUNTER — Encounter
Admission: RE | Admit: 2017-03-21 | Discharge: 2017-03-21 | Disposition: A | Discharge status: Home / Self Care | Payer: Medicare Other | Source: Ambulatory Visit | Attending: General Surgery | Admitting: General Surgery

## 2017-03-21 ENCOUNTER — Ambulatory Visit
Admission: RE | Admit: 2017-03-21 | Discharge: 2017-03-21 | Disposition: A | Discharge status: Home / Self Care | Payer: Medicare Other | Source: Ambulatory Visit | Attending: General Surgery | Admitting: General Surgery

## 2017-03-21 DIAGNOSIS — Z01812 Encounter for preprocedural laboratory examination: Secondary | ICD-10-CM | POA: Diagnosis present

## 2017-03-21 DIAGNOSIS — J984 Other disorders of lung: Secondary | ICD-10-CM | POA: Diagnosis not present

## 2017-03-21 DIAGNOSIS — Z01810 Encounter for preprocedural cardiovascular examination: Secondary | ICD-10-CM

## 2017-03-21 DIAGNOSIS — I700 Atherosclerosis of aorta: Secondary | ICD-10-CM | POA: Insufficient documentation

## 2017-03-21 HISTORY — DX: Benign prostatic hyperplasia without lower urinary tract symptoms: N40.0

## 2017-03-21 HISTORY — DX: Gastro-esophageal reflux disease without esophagitis: K21.9

## 2017-03-21 HISTORY — DX: Dyspnea, unspecified: R06.00

## 2017-03-21 HISTORY — DX: Unspecified dementia, unspecified severity, without behavioral disturbance, psychotic disturbance, mood disturbance, and anxiety: F03.90

## 2017-03-21 LAB — PROTIME-INR
INR: 1.29
Prothrombin Time: 16.2 seconds — ABNORMAL HIGH (ref 11.4–15.2)

## 2017-03-21 LAB — BASIC METABOLIC PANEL
Anion gap: 8 (ref 5–15)
BUN: 18 mg/dL (ref 6–20)
CALCIUM: 9.4 mg/dL (ref 8.9–10.3)
CO2: 30 mmol/L (ref 22–32)
Chloride: 102 mmol/L (ref 101–111)
Creatinine, Ser: 1.03 mg/dL (ref 0.61–1.24)
GFR calc Af Amer: >60 mL/min (ref >60)
Glucose, Bld: 95 mg/dL (ref 65–99)
POTASSIUM: 4 mmol/L (ref 3.5–5.1)
SODIUM: 140 mmol/L (ref 135–145)

## 2017-03-21 LAB — CBC WITH DIFFERENTIAL/PLATELET
BASOS PCT: 1 %
Basophils Absolute: 0 10*3/uL (ref 0–0.1)
Eosinophils Absolute: 0.3 10*3/uL (ref 0–0.7)
Eosinophils Relative: 6 %
HCT: 41.4 % (ref 40.0–52.0)
HEMOGLOBIN: 14.3 g/dL (ref 13.0–18.0)
Lymphocytes Relative: 22 %
Lymphs Abs: 1.1 10*3/uL (ref 1.0–3.6)
MCH: 33.6 pg (ref 26.0–34.0)
MCHC: 34.6 g/dL (ref 32.0–36.0)
MCV: 96.9 fL (ref 80.0–100.0)
MONO ABS: 0.5 10*3/uL (ref 0.2–1.0)
Monocytes Relative: 10 %
NEUTROS ABS: 3 10*3/uL (ref 1.4–6.5)
Neutrophils Relative %: 61 %
Platelets: 181 10*3/uL (ref 150–440)
RBC: 4.27 MIL/uL — ABNORMAL LOW (ref 4.40–5.90)
RDW: 13.9 % (ref 11.5–14.5)
WBC: 4.9 10*3/uL (ref 3.8–10.6)

## 2017-03-21 LAB — APTT: aPTT: 46 seconds — ABNORMAL HIGH (ref 24–36)

## 2017-03-21 MED ORDER — CHLORHEXIDINE GLUCONATE CLOTH 2 % EX PADS
6.0000 | MEDICATED_PAD | Freq: Once | CUTANEOUS | Status: DC
  Filled 2017-03-21: qty 6

## 2017-03-21 NOTE — Pre-Procedure Instructions (Signed)
Notified Dr Maisie Fushomas, anesthesia, regarding evaluation request by Dr Tonita CongWoodham.  "We should be OK to proceed."

## 2017-03-21 NOTE — Patient Instructions (Signed)
Your procedure is scheduled on: 03/26/17 Tues Report to Same Day Surgery 2nd floor medical mall [facility](Medical Mall Entrance-take elevator on left to 2nd floor.  Check in with surgery information desk.) To find out your arrival time please call [phone] between 1PM - 3PM on 03/25/17 Mon  Remember: Instructions that are not followed completely may result in serious medical risk, up to and including death, or upon the discretion of your surgeon and anesthesiologist your surgery may need to be rescheduled.    _x___ 1. Do not eat food or drink liquids after midnight. No gum chewing or                              hard candies.     __x__ 2. No Alcohol for 24 hours before or after surgery.   __x__3. No Smoking for 24 prior to surgery.   ____  4. Bring all medications with you on the day of surgery if instructed.    __x__ 5. Notify your doctor if there is any change in your medical condition     (cold, fever, infections).     Do not wear jewelry, make-up, hairpins, clips or nail polish.  Do not wear lotions, powders, or perfumes. You may wear deodorant.  Do not shave 48 hours prior to surgery. Men may shave face and neck.  Do not bring valuables to the hospital.    [facility] is not responsible for any belongings or valuables.               Contacts, dentures or bridgework may not be worn into surgery.  Leave your suitcase in the car. After surgery it may be brought to your room.  For patients admitted to the hospital, discharge time is determined by your                       treatment team.   Patients discharged the day of surgery will not be allowed to drive home.  You will need someone to drive you home and stay with you the night of your procedure.    Please read over the following fact sheets that you were given:   [facility] Preparing for [facility] and or MRSA Information   _x___ Take anti-hypertensive (unless it includes a diuretic), cardiac, seizure, asthma,     anti-reflux and  psychiatric medicines. These include:  1.carvedilol (COREG)   2.amiodarone (PACERONE)   3.omeprazole (PRILOSEC  4.  5.  6.  ____Fleets enema or Magnesium Citrate as directed.   _x___ Use CHG Soap or sage wipes as directed on instruction sheet   ____ Use inhalers on the day of surgery and bring to hospital day of surgery  ____ Stop Metformin and Janumet 2 days prior to surgery.    ____ Take 1/2 of usual insulin dose the night before surgery and none on the morning     surgery.   _x___ Follow recommendations from Cardiologist, Pulmonologist or PCP regarding          stopping Aspirin, Coumadin, Pllavix ,Eliquis, Effient, or Pradaxa, and Pletal.  X____Stop Anti-inflammatories such as Advil, Aleve, Ibuprofen, Motrin, Naproxen, Naprosyn, Goodies powders or aspirin products. OK to take Tylenol and                          Celebrex.   _x___ Stop supplements until after surgery.  But may continue  Vitamin D, Vitamin B,       and multivitamin.   ____ Bring C-Pap to the hospital.

## 2017-03-25 MED ORDER — CEFAZOLIN SODIUM-DEXTROSE 2-4 GM/100ML-% IV SOLN: 2.0000 g | INTRAVENOUS | Status: AC

## 2017-03-26 ENCOUNTER — Ambulatory Visit: Payer: Medicare Other | Admitting: Certified Registered"

## 2017-03-26 ENCOUNTER — Ambulatory Visit: Payer: Medicare Other | Admitting: General Surgery

## 2017-03-26 ENCOUNTER — Ambulatory Visit
Admission: RE | Admit: 2017-03-26 | Discharge: 2017-03-26 | Disposition: A | Discharge status: Home / Self Care | Payer: Medicare Other | Source: Ambulatory Visit | Attending: General Surgery | Admitting: General Surgery

## 2017-03-26 ENCOUNTER — Encounter: Payer: Self-pay | Admitting: *Deleted

## 2017-03-26 ENCOUNTER — Encounter
Admission: RE | Disposition: A | Discharge status: Home / Self Care | Payer: Self-pay | Source: Ambulatory Visit | Attending: General Surgery

## 2017-03-26 DIAGNOSIS — M1990 Unspecified osteoarthritis, unspecified site: Secondary | ICD-10-CM | POA: Insufficient documentation

## 2017-03-26 DIAGNOSIS — Z951 Presence of aortocoronary bypass graft: Secondary | ICD-10-CM | POA: Diagnosis not present

## 2017-03-26 DIAGNOSIS — K219 Gastro-esophageal reflux disease without esophagitis: Secondary | ICD-10-CM | POA: Diagnosis not present

## 2017-03-26 DIAGNOSIS — I480 Paroxysmal atrial fibrillation: Secondary | ICD-10-CM | POA: Diagnosis not present

## 2017-03-26 DIAGNOSIS — I2510 Atherosclerotic heart disease of native coronary artery without angina pectoris: Secondary | ICD-10-CM | POA: Insufficient documentation

## 2017-03-26 DIAGNOSIS — Z8249 Family history of ischemic heart disease and other diseases of the circulatory system: Secondary | ICD-10-CM | POA: Insufficient documentation

## 2017-03-26 DIAGNOSIS — G4733 Obstructive sleep apnea (adult) (pediatric): Secondary | ICD-10-CM | POA: Insufficient documentation

## 2017-03-26 DIAGNOSIS — J449 Chronic obstructive pulmonary disease, unspecified: Secondary | ICD-10-CM | POA: Insufficient documentation

## 2017-03-26 DIAGNOSIS — Z79899 Other long term (current) drug therapy: Secondary | ICD-10-CM | POA: Diagnosis not present

## 2017-03-26 DIAGNOSIS — Z7901 Long term (current) use of anticoagulants: Secondary | ICD-10-CM | POA: Diagnosis not present

## 2017-03-26 DIAGNOSIS — Z7951 Long term (current) use of inhaled steroids: Secondary | ICD-10-CM | POA: Diagnosis not present

## 2017-03-26 DIAGNOSIS — F419 Anxiety disorder, unspecified: Secondary | ICD-10-CM | POA: Diagnosis not present

## 2017-03-26 DIAGNOSIS — Z885 Allergy status to narcotic agent status: Secondary | ICD-10-CM | POA: Diagnosis not present

## 2017-03-26 DIAGNOSIS — Z888 Allergy status to other drugs, medicaments and biological substances status: Secondary | ICD-10-CM | POA: Insufficient documentation

## 2017-03-26 DIAGNOSIS — K4090 Unilateral inguinal hernia, without obstruction or gangrene, not specified as recurrent: Secondary | ICD-10-CM | POA: Diagnosis not present

## 2017-03-26 DIAGNOSIS — I252 Old myocardial infarction: Secondary | ICD-10-CM | POA: Insufficient documentation

## 2017-03-26 HISTORY — PX: INGUINAL HERNIA REPAIR: SHX194

## 2017-03-26 SURGERY — REPAIR, HERNIA, INGUINAL, ADULT
Anesthesia: General | Laterality: Right

## 2017-03-26 MED ORDER — LIDOCAINE HCL 1 % IJ SOLN
INTRAMUSCULAR | Status: DC | PRN
  Administered 2017-03-26: 10 mL

## 2017-03-26 MED ORDER — ROCURONIUM BROMIDE 50 MG/5ML IV SOLN
INTRAVENOUS | Status: AC
  Filled 2017-03-26: qty 1

## 2017-03-26 MED ORDER — GLYCOPYRROLATE 0.2 MG/ML IJ SOLN
INTRAMUSCULAR | Status: AC
  Filled 2017-03-26: qty 1

## 2017-03-26 MED ORDER — CEFAZOLIN SODIUM-DEXTROSE 2-4 GM/100ML-% IV SOLN
INTRAVENOUS | Status: AC
  Filled 2017-03-26: qty 100

## 2017-03-26 MED ORDER — OXYCODONE HCL 5 MG PO TABS
2.5000 mg | ORAL_TABLET | Freq: Once | ORAL | Status: AC
  Administered 2017-03-26: 2.5 mg via ORAL

## 2017-03-26 MED ORDER — HYDRALAZINE HCL 20 MG/ML IJ SOLN
10.0000 mg | Freq: Once | INTRAMUSCULAR | Status: AC
  Administered 2017-03-26: 17:00:00 via INTRAVENOUS

## 2017-03-26 MED ORDER — FENTANYL CITRATE (PF) 100 MCG/2ML IJ SOLN
INTRAMUSCULAR | Status: AC
  Administered 2017-03-26: 25 ug via INTRAVENOUS
  Filled 2017-03-26: qty 2

## 2017-03-26 MED ORDER — ETOMIDATE 2 MG/ML IV SOLN
INTRAVENOUS | Status: DC | PRN
  Administered 2017-03-26: 18 mg via INTRAVENOUS

## 2017-03-26 MED ORDER — ONDANSETRON HCL 4 MG/2ML IJ SOLN
INTRAMUSCULAR | Status: AC
  Filled 2017-03-26: qty 2

## 2017-03-26 MED ORDER — LABETALOL HCL 5 MG/ML IV SOLN
INTRAVENOUS | Status: AC
  Administered 2017-03-26: 5 mg via INTRAVENOUS
  Filled 2017-03-26: qty 4

## 2017-03-26 MED ORDER — LABETALOL HCL 5 MG/ML IV SOLN
5.0000 mg | INTRAVENOUS | Status: AC | PRN
  Administered 2017-03-26 (×4): 5 mg via INTRAVENOUS

## 2017-03-26 MED ORDER — HYDRALAZINE HCL 20 MG/ML IJ SOLN
INTRAMUSCULAR | Status: AC
  Filled 2017-03-26: qty 1

## 2017-03-26 MED ORDER — LIDOCAINE HCL (CARDIAC) 20 MG/ML IV SOLN
INTRAVENOUS | Status: DC | PRN
  Administered 2017-03-26: 100 mg via INTRAVENOUS

## 2017-03-26 MED ORDER — FENTANYL CITRATE (PF) 100 MCG/2ML IJ SOLN
25.0000 ug | INTRAMUSCULAR | Status: DC | PRN
  Administered 2017-03-26 (×3): 25 ug via INTRAVENOUS

## 2017-03-26 MED ORDER — ROCURONIUM BROMIDE 100 MG/10ML IV SOLN
INTRAVENOUS | Status: DC | PRN
  Administered 2017-03-26: 35 mg via INTRAVENOUS
  Administered 2017-03-26: 5 mg via INTRAVENOUS

## 2017-03-26 MED ORDER — CEFAZOLIN SODIUM-DEXTROSE 2-3 GM-% IV SOLR
INTRAVENOUS | Status: DC | PRN
  Administered 2017-03-26: 2 g via INTRAVENOUS

## 2017-03-26 MED ORDER — ONDANSETRON HCL 4 MG/2ML IJ SOLN
INTRAMUSCULAR | Status: DC | PRN
  Administered 2017-03-26: 4 mg via INTRAVENOUS

## 2017-03-26 MED ORDER — SUGAMMADEX SODIUM 200 MG/2ML IV SOLN
INTRAVENOUS | Status: DC | PRN
  Administered 2017-03-26: 170 mg via INTRAVENOUS

## 2017-03-26 MED ORDER — GLYCOPYRROLATE 0.2 MG/ML IJ SOLN
INTRAMUSCULAR | Status: DC | PRN
  Administered 2017-03-26: 0.2 mg via INTRAVENOUS

## 2017-03-26 MED ORDER — FENTANYL CITRATE (PF) 100 MCG/2ML IJ SOLN
INTRAMUSCULAR | Status: AC
  Filled 2017-03-26: qty 2

## 2017-03-26 MED ORDER — ACETAMINOPHEN 325 MG PO TABS
325.0000 mg | ORAL_TABLET | Freq: Once | ORAL | Status: AC
  Administered 2017-03-26: 325 mg via ORAL

## 2017-03-26 MED ORDER — ONDANSETRON HCL 4 MG/2ML IJ SOLN
4.0000 mg | Freq: Once | INTRAMUSCULAR | Status: DC | PRN
Start: 2017-03-26 — End: 2017-03-26

## 2017-03-26 MED ORDER — SODIUM CHLORIDE 0.9 % IV SOLN
INTRAVENOUS | Status: DC | PRN
  Administered 2017-03-26: 40 mL

## 2017-03-26 MED ORDER — LIDOCAINE HCL (PF) 1 % IJ SOLN
INTRAMUSCULAR | Status: AC
  Filled 2017-03-26: qty 30

## 2017-03-26 MED ORDER — EPHEDRINE SULFATE 50 MG/ML IJ SOLN
INTRAMUSCULAR | Status: DC | PRN
  Administered 2017-03-26 (×2): 10 mg via INTRAVENOUS

## 2017-03-26 MED ORDER — BUPIVACAINE LIPOSOME 1.3 % IJ SUSP
INTRAMUSCULAR | Status: AC
  Filled 2017-03-26: qty 20

## 2017-03-26 MED ORDER — ETOMIDATE 2 MG/ML IV SOLN
INTRAVENOUS | Status: AC
  Filled 2017-03-26: qty 10

## 2017-03-26 MED ORDER — ACETAMINOPHEN 325 MG PO TABS
ORAL_TABLET | ORAL | Status: AC
  Administered 2017-03-26: 325 mg via ORAL
  Filled 2017-03-26: qty 1

## 2017-03-26 MED ORDER — EPHEDRINE SULFATE 50 MG/ML IJ SOLN
INTRAMUSCULAR | Status: AC
  Filled 2017-03-26: qty 1

## 2017-03-26 MED ORDER — LACTATED RINGERS IV SOLN
INTRAVENOUS | Status: DC
  Administered 2017-03-26: 14:00:00 via INTRAVENOUS

## 2017-03-26 MED ORDER — SODIUM CHLORIDE FLUSH 0.9 % IV SOLN
INTRAVENOUS | Status: AC
  Filled 2017-03-26: qty 20

## 2017-03-26 MED ORDER — SUCCINYLCHOLINE CHLORIDE 20 MG/ML IJ SOLN
INTRAMUSCULAR | Status: DC | PRN
  Administered 2017-03-26: 100 mg via INTRAVENOUS

## 2017-03-26 MED ORDER — OXYCODONE-ACETAMINOPHEN 2.5-325 MG PO TABS: 1.0000 | ORAL_TABLET | ORAL | 0 refills | Status: DC | PRN

## 2017-03-26 MED ORDER — FENTANYL CITRATE (PF) 100 MCG/2ML IJ SOLN
INTRAMUSCULAR | Status: DC | PRN
  Administered 2017-03-26: 25 ug via INTRAVENOUS
  Administered 2017-03-26: 50 ug via INTRAVENOUS

## 2017-03-26 MED ORDER — LIDOCAINE HCL (PF) 2 % IJ SOLN
INTRAMUSCULAR | Status: AC
  Filled 2017-03-26: qty 2

## 2017-03-26 MED ORDER — OXYCODONE HCL 5 MG PO TABS
ORAL_TABLET | ORAL | Status: DC
Start: 2017-03-26 — End: 2017-03-26
  Filled 2017-03-26: qty 1

## 2017-03-26 MED ORDER — SUGAMMADEX SODIUM 200 MG/2ML IV SOLN
INTRAVENOUS | Status: AC
  Filled 2017-03-26: qty 2

## 2017-03-26 SURGICAL SUPPLY — 31 items
BLADE SURG 15 STRL LF DISP TIS (BLADE) ×1 IMPLANT
BLADE SURG 15 STRL SS (BLADE) ×1
CHLORAPREP W/TINT 26ML (MISCELLANEOUS) ×2 IMPLANT
DERMABOND ADVANCED (GAUZE/BANDAGES/DRESSINGS) ×1
DERMABOND ADVANCED .7 DNX12 (GAUZE/BANDAGES/DRESSINGS) ×1 IMPLANT
DRAIN PENROSE 1/4X12 LTX (DRAIN) ×2 IMPLANT
DRAPE LAPAROTOMY 100X77 ABD (DRAPES) ×2 IMPLANT
DRAPE SHEET LG 3/4 BI-LAMINATE (DRAPES) ×2 IMPLANT
ELECT CAUTERY BLADE 6.4 (BLADE) ×2 IMPLANT
ELECT REM PT RETURN 9FT ADLT (ELECTROSURGICAL) ×2
ELECTRODE REM PT RTRN 9FT ADLT (ELECTROSURGICAL) ×1 IMPLANT
GLOVE BIO SURGEON STRL SZ7.5 (GLOVE) ×2 IMPLANT
GLOVE INDICATOR 8.0 STRL GRN (GLOVE) ×2 IMPLANT
GOWN STRL REUS W/ TWL LRG LVL3 (GOWN DISPOSABLE) ×2 IMPLANT
GOWN STRL REUS W/TWL LRG LVL3 (GOWN DISPOSABLE) ×2
KIT RM TURNOVER STRD PROC AR (KITS) ×2 IMPLANT
LABEL OR SOLS (LABEL) ×2 IMPLANT
MESH PARIETEX PROGRIP RIGHT (Mesh General) ×2 IMPLANT
NDL SAFETY 25GX1.5 (NEEDLE) ×4 IMPLANT
NS IRRIG 500ML POUR BTL (IV SOLUTION) ×2 IMPLANT
PACK BASIN MINOR ARMC (MISCELLANEOUS) ×2 IMPLANT
SUT ETHIBOND 0 MO6 C/R (SUTURE) ×2 IMPLANT
SUT MNCRL 4-0 (SUTURE) ×1
SUT MNCRL 4-0 27XMFL (SUTURE) ×1
SUT SILK 2 0 SH (SUTURE) IMPLANT
SUT VIC AB 2-0 CT2 27 (SUTURE) ×4 IMPLANT
SUT VIC AB 3-0 SH 27 (SUTURE) ×1
SUT VIC AB 3-0 SH 27X BRD (SUTURE) ×1 IMPLANT
SUTURE MNCRL 4-0 27XMF (SUTURE) ×1 IMPLANT
SYR 20CC LL (SYRINGE) ×2 IMPLANT
SYRINGE 10CC LL (SYRINGE) ×2 IMPLANT

## 2017-03-26 NOTE — Op Note (Signed)
   Pre-operative Diagnosis: Right inguinal hernia  Post-operative Diagnosis: Indirect right inguinal hernia  Procedure performed: Open right inguinal hernia repair  Surgeon: Ricarda Frameharles Arn Mcomber   Assistants: PA student  Anesthesia: General endotracheal anesthesia  ASA Class: 2  Surgeon: Ricarda Frameharles Royale Lennartz, MD FACS  Anesthesia: Gen. with endotracheal tube  Assistant: As above  Procedure Details  The patient was seen again in the Holding Room. The benefits, complications, treatment options, and expected outcomes were discussed with the patient. The risks of bleeding, infection, recurrence of symptoms, failure to resolve symptoms,  bowel injury, any of which could require further surgery were reviewed with the patient.   The patient was taken to Operating Room, identified as Keith Estes and the procedure verified.  A Time Out was held and the above information confirmed.  Prior to the induction of general anesthesia, antibiotic prophylaxis was administered. VTE prophylaxis was in place. General endotracheal anesthesia was then administered and tolerated well. After the induction, the abdomen was prepped with Chloraprep and draped in the sterile fashion. The patient was positioned in the supine position.  The procedure began with a field block with 1% lidocaine to the right groin. Skin was incised with a 15 blade scalpel taken down to the level of the external oblique fascia. This was cleared off until the external opening of the inguinal canal was identified. The fascia was then sharply entered into with a 15 blade scalpel and Metzenbaum scissors. The spermatic cord was then isolated and elevated with a Penrose drain. This allowed visualization of an indirect inguinal hernia defect and large cord lipoma. The cord lipoma was dissected free of the cord structures and passed off the field. The indirect hernia then was able to be reduced. The patient was noted have a very lax direct space which was  reinforced with interrupted 0 Ethibond sutures. The inguinal hernia was then fully reduced. The inguinal nerve was never identified.  At this point a right-sided progrip mesh was brought to the field. It was cut to the appropriate size and placed around the spermatic cord were was able to lay flat covering both the direct and indirect spaces. It was secured with interrupted 2-0 Vicryl suture. Entire area was completely irrigated. Lap is on bupivacaine was then injected for an additional field block. The external oblique fascia was then closed with a running 2-0 Vicryl suture. The Penrose drain was removed. Scarpus tissue and deep dermal tissues were closed with interrupted 3-0 Vicryl suture. The skin was closed with a running subcuticular 4-0 Monocryl.  Patient tolerated the procedure well. All counts were correct at the end of the procedure. Bilateral testicles were confirmed within the scrotum. Patient was awoken from general anesthesia and checked her to the PACU in good condition.  Findings: Indirect right inguinal hernia   Estimated Blood Loss: 10 mL         Drains: None         Specimens: None          Complications: None                  Condition: Good   Ricarda Frameharles Tarita Deshmukh, MD, FACS
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
## 2017-03-26 NOTE — Progress Notes (Signed)
Another dose of labetalol for high blood pressure

## 2017-03-26 NOTE — Brief Op Note (Signed)
03/26/2017  3:42 PM  PATIENT:  Soundra Pilonarroll C Fussner  83 y.o. male  PRE-OPERATIVE DIAGNOSIS:  Right inguinal hernia  POST-OPERATIVE DIAGNOSIS:  Right inguinal hernia  PROCEDURE:  Procedure(s): HERNIA REPAIR INGUINAL ADULT (Right)  SURGEON:  Surgeon(s) and Role:    * Ricarda FrameWoodham, Millard Bautch, MD - Primary  PHYSICIAN ASSISTANT:   ASSISTANTS: PA student   ANESTHESIA:   general  EBL:  No intake/output data recorded.  BLOOD ADMINISTERED:none  DRAINS: none   LOCAL MEDICATIONS USED:  OTHER liposomal bupivacaine  SPECIMEN:  No Specimen  DISPOSITION OF SPECIMEN:  N/A  COUNTS:  YES  TOURNIQUET:  * No tourniquets in log *  DICTATION: .Dragon Dictation  PLAN OF CARE: Discharge to home after PACU  PATIENT DISPOSITION:  PACU - hemodynamically stable.   Delay start of Pharmacological VTE agent (>24hrs) due to surgical blood loss or risk of bleeding: not applicable

## 2017-03-26 NOTE — Progress Notes (Signed)
Family members at bedside

## 2017-03-26 NOTE — Anesthesia Post-op Follow-up Note (Signed)
Anesthesia QCDR form completed.        
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
## 2017-03-26 NOTE — Anesthesia Procedure Notes (Signed)
Procedure Name: Intubation Performed by: Lance Muss Pre-anesthesia Checklist: Patient identified, Patient being monitored, Timeout performed, Emergency Drugs available and Suction available Patient Re-evaluated:Patient Re-evaluated prior to induction Oxygen Delivery Method: Circle system utilized Preoxygenation: Pre-oxygenation with 100% oxygen Induction Type: IV induction Ventilation: Mask ventilation without difficulty Laryngoscope Size: Mac and 4 Grade View: Grade I Tube type: Oral Tube size: 7.5 mm Number of attempts: 1 Airway Equipment and Method: Stylet Placement Confirmation: ETT inserted through vocal cords under direct vision,  positive ETCO2 and breath sounds checked- equal and bilateral Secured at: 23 cm Tube secured with: Tape Dental Injury: Teeth and Oropharynx as per pre-operative assessment

## 2017-03-26 NOTE — Discharge Instructions (Signed)
AMBULATORY SURGERY  DISCHARGE INSTRUCTIONS   1) The drugs that you were given will stay in your system until tomorrow so for the next 24 hours you should not:  A) Drive an automobile B) Make any legal decisions C) Drink any alcoholic beverage   2) You may resume regular meals tomorrow.  Today it is better to start with liquids and gradually work up to solid foods.  You may eat anything you prefer, but it is better to start with liquids, then soup and crackers, and gradually work up to solid foods.   3) Please notify your doctor immediately if you have any unusual bleeding, trouble breathing, redness and pain at the surgery site, drainage, fever, or pain not relieved by medication.    4) Additional Instructions:        Please contact your physician with any problems or Same Day Surgery at [phone], Monday through Friday 6 am to 4 pm, or [place] at [facility] Main number at [phone].      Inguinal Hernia, Adult An inguinal hernia is when fat or the intestines push through the area where the leg meets the lower abdomen (groin) and create a rounded lump (bulge). This condition develops over time. There are three types of inguinal hernias. These types include:  Hernias that can be pushed back into the belly (are reducible).  Hernias that are not reducible (are incarcerated).  Hernias that are not reducible and lose their blood supply (are strangulated). This type of hernia requires emergency surgery.  What are the causes? This condition is caused by having a weak spot in the muscles or tissue. This weakness lets the hernia poke through. This condition can be triggered by:  Suddenly straining the muscles of the lower abdomen.  Lifting heavy objects.  Straining to have a bowel movement. Difficult bowel movements (constipation) can lead to this.  Coughing.  What increases the risk? This condition is more likely to develop in:  Men.  Pregnant  women.  People who: ? Are overweight. ? Work in jobs that require long periods of standing or heavy lifting. ? Have had an inguinal hernia before. ? Smoke or have lung disease. These factors can lead to long-lasting (chronic) coughing.  What are the signs or symptoms? Symptoms can depend on the size of the hernia. Often, a small inguinal hernia has no symptoms. Symptoms of a larger hernia include:  A lump in the groin. This is easier to see when the person is standing. It might not be visible when he or she is lying down.  Pain or burning in the groin. This occurs especially when lifting, straining, or coughing.  A dull ache or a feeling of pressure in the groin.  A lump in the scrotum in men.  Symptoms of a strangulated inguinal hernia can include:  A bulge in the groin that is very painful and tender to the touch.  A bulge that turns red or purple.  Fever, nausea, and vomiting.  The inability to have a bowel movement or to pass gas.  How is this diagnosed? This condition is diagnosed with a medical history and physical exam. Your health care provider may feel your groin area and ask you to cough. How is this treated? Treatment for this condition varies depending on the size of your hernia and whether you have symptoms. If you do not have symptoms, your health care provider may have you watch your hernia carefully and come in for follow-up visits. If your hernia  is larger or if you have symptoms, your treatment will include surgery. Follow these instructions at home: Lifestyle  Drink enough fluid to keep your urine clear or pale yellow.  Eat a diet that includes a lot of fiber. Eat plenty of fruits, vegetables, and whole grains. Talk with your health care provider if you have questions.  Avoid lifting heavy objects.  Avoid standing for long periods of time.  Do not use tobacco products, including cigarettes, chewing tobacco, or e-cigarettes. If you need help quitting, ask  your health care provider.  Maintain a healthy weight. General instructions  Do not try to force the hernia back in.  Watch your hernia for any changes in color or size. Let your health care provider know if any changes occur.  Take over-the-counter and prescription medicines only as told by your health care provider.  Keep all follow-up visits as told by your health care provider. This is important. Contact a health care provider if:  You have a fever.  You have new symptoms.  Your symptoms get worse. Get help right away if:  You have pain in the groin that suddenly gets worse.  A bulge in the groin gets bigger suddenly and does not go down.  You are a man and you have a sudden pain in the scrotum, or the size of your scrotum suddenly changes.  A bulge in the groin area becomes red or purple and is painful to the touch.  You have nausea or vomiting that does not go away.  You feel your heart beating a lot more quickly than normal.  You cannot have a bowel movement or pass gas. This information is not intended to replace advice given to you by your health care provider. Make sure you discuss any questions you have with your health care provider. Document Released: 12/23/2008 Document Revised: 01/12/2016 Document Reviewed: 06/16/2014 Elsevier Interactive Patient Education  2018 [employer].

## 2017-03-26 NOTE — Transfer of Care (Signed)
Immediate Anesthesia Transfer of Care Note  Patient: Keith Estes  Procedure(s) Performed: Procedure(s): HERNIA REPAIR INGUINAL ADULT (Right)  Patient Location: PACU  Anesthesia Type:General  Level of Consciousness: drowsy and patient cooperative  Airway & Oxygen Therapy: Patient Spontanous Breathing and Patient connected to face mask oxygen  Post-op Assessment: Report given to RN and Post -op Vital signs reviewed and stable  Post vital signs: Reviewed and stable  Last Vitals:  Vitals:   03/26/17 1247 03/26/17 1550  BP: (!) 155/95 (!) 162/88  Pulse: (!) 57 64  Resp: 16 10  Temp: 36.6 C 36.8 C    Last Pain:  Vitals:   03/26/17 1247  TempSrc: Oral  PainSc: 2          Complications: No apparent anesthesia complications

## 2017-03-26 NOTE — Progress Notes (Signed)
Blood pressure 170/101  Labetalol given

## 2017-03-26 NOTE — Anesthesia Postprocedure Evaluation (Signed)
Anesthesia Post Note  Patient: Keith Estes  Procedure(s) Performed: Procedure(s) (LRB): HERNIA REPAIR INGUINAL ADULT (Right)  Patient location during evaluation: PACU Anesthesia Type: General Level of consciousness: awake and alert Pain management: pain level controlled Vital Signs Assessment: post-procedure vital signs reviewed and stable Respiratory status: spontaneous breathing, nonlabored ventilation, respiratory function stable and patient connected to nasal cannula oxygen Cardiovascular status: blood pressure returned to baseline and stable Postop Assessment: no signs of nausea or vomiting Anesthetic complications: no     Last Vitals:  Vitals:   03/26/17 1819 03/26/17 1839  BP: (!) 143/74 126/90  Pulse: 72 68  Resp:    Temp:  36.8 C    Last Pain:  Vitals:   03/26/17 1839  TempSrc: Oral  PainSc: 3                  Lenard SimmerAndrew Laymon Stockert

## 2017-03-26 NOTE — Progress Notes (Signed)
Another dose of blood pressure medication given for blood pressure 176/108

## 2017-03-26 NOTE — Progress Notes (Signed)
Last dose of labetalol given for blood pressure of 188/108

## 2017-03-26 NOTE — Interval H&P Note (Signed)
History and Physical Interval Note:  03/26/2017 2:16 PM  Keith Estes  has presented today for surgery, with the diagnosis of right inguinal hernia  The various methods of treatment have been discussed with the patient and family. After consideration of risks, benefits and other options for treatment, the patient has consented to  Procedure(s): HERNIA REPAIR INGUINAL ADULT (Right) as a surgical intervention .  The patient's history has been reviewed, patient examined, no change in status, stable for surgery.  I have reviewed the patient's chart and labs.  Questions were answered to the patient's satisfaction.     Ricarda Frameharles Darothy Courtright

## 2017-03-26 NOTE — Progress Notes (Signed)
Hydralazine given for blood pressure 

## 2017-03-26 NOTE — Progress Notes (Signed)
Head of penis purplish color  Dr Tonita Congwoodham aware and checked patient in recovery room

## 2017-03-27 ENCOUNTER — Encounter: Payer: Self-pay | Admitting: General Surgery

## 2017-03-27 ENCOUNTER — Other Ambulatory Visit: Payer: Self-pay | Admitting: General Practice

## 2017-03-27 MED ORDER — OXYCODONE-ACETAMINOPHEN 5-325 MG PO TABS: 1.0000 | ORAL_TABLET | Freq: Four times a day (QID) | ORAL | 0 refills | Status: DC | PRN

## 2017-03-27 NOTE — Telephone Encounter (Signed)
Called and spoke with Keith Estes to let her know that her dad's prescription is left at the front desk. She stated that she would look for someone to pick up his prescription since her car was broke down.  Post-op call made to patient at this time. Spoke with Cl[employer] CompanySherilyn. Post-op interview questions below.  1. How are you feeling? With some pain since he was not able to get his pain medication.  2. Is your pain controlled? Not at this time but hopefully it will get better after he could start his pain medication.  3. What are you doing for the pain? Not well  4. Are you having any Nausea or Vomiting? No  5. Are you having any Fever or Chills? No   6. Are you having any Constipation or Diarrhea? No  7. Is there any Swelling or Bruising you are concerned about? No  8. Do you have any questions or concerns at this time? no   Discussion: Patient's new prescription was left at the front desk.

## 2017-03-27 NOTE — Telephone Encounter (Signed)
Post-op call made to patient at this time. Spoke with __________. Post-op interview questions below.  1. How are you feeling? ___________  2. Is your pain controlled? ____________  3. What are you doing for the pain? ____________  4. Are you having any Nausea or Vomiting? ______________  5. Are you having any Fever or Chills? ______________  6. Are you having any Constipation or Diarrhea? _____________  7. Is there any Swelling or Bruising you are concerned about? ________________  8. Do you have any questions or concerns at this time? _______________   Discussion: ____________________________  

## 2017-03-27 NOTE — Telephone Encounter (Signed)
Patients daughter is calling said the medication oxycodone-acetaminophen was sent in to the pharmacy for  2.5, she is calling said the pharmacy doesn't carry this mg, that we could just send another one that would be the next one up and patient could cut in half. She [place] [facility] [place]. Please call patient and advice.

## 2017-03-27 NOTE — Telephone Encounter (Signed)
Patient's daughter Malva CoganSherilyn called wanting for us to send his father a new pain medicine to their pharmacy since they don't carry the dosage that was prescribed. I told her that Dr. Tonita CongWoodham was in surgery this morning but as soon as he gets here I would have to ask him to write a new one. I told [name] that since it was a narcotic, she would have to come and pick it up. She was upset about that but I explained that since it was a narcotic, it would have to be picked up.   I will call [name] back once I have the prescription ready.

## 2017-03-28 ENCOUNTER — Encounter: Payer: Self-pay | Admitting: General Surgery

## 2017-03-29 ENCOUNTER — Telehealth: Payer: Self-pay

## 2017-03-29 NOTE — Telephone Encounter (Signed)
Patient's daughter called stating that the patient has yet to have a bowel movement and was concerned. Stated that he has been taking the colace and was wanting to know if there was something else he could take instead. I suggested that he try taking Miralax once daily to start to get his bowels moving and that if he has not had a bowel movement by Monday to give our office a call. Patients daughter verbalized understanding at this time.

## 2017-03-31 ENCOUNTER — Ambulatory Visit
Admission: EM | Admit: 2017-03-31 | Discharge: 2017-03-31 | Disposition: A | Discharge status: Home / Self Care | Payer: Medicare Other | Attending: Family Medicine | Admitting: Family Medicine

## 2017-03-31 DIAGNOSIS — K5903 Drug induced constipation: Secondary | ICD-10-CM | POA: Diagnosis not present
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
## 2017-03-31 NOTE — ED Triage Notes (Signed)
Pt reports inguinal hernia surgery on Tuesday. Last BM on Monday. Pt concerned over constipation. Reports small amount of liquid stool only.  Also reports he got out of bed last p.m. And feels like he pulled something in his surgical area. Pain 4/10 at surgical site. Ankle swelling x past 2 days. F/u appt with surgery on Aug 21

## 2017-04-09 ENCOUNTER — Encounter: Payer: Self-pay | Admitting: Surgery

## 2017-04-09 ENCOUNTER — Ambulatory Visit (INDEPENDENT_AMBULATORY_CARE_PROVIDER_SITE_OTHER): Payer: Medicare Other | Admitting: Surgery

## 2017-04-09 VITALS — BP 129/79 | HR 62 | Temp 97.8°F | Wt 200.0 lb

## 2017-04-09 DIAGNOSIS — Z09 Encounter for follow-up examination after completed treatment for conditions other than malignant neoplasm: Secondary | ICD-10-CM

## 2017-04-09 NOTE — Progress Notes (Signed)
04/09/2017  HPI: Patient is s/p open right inguinal hernia repair with mesh with Dr. Tonita Cong on 8/7.  Presents today for follow up.  He has had issues with constipation and then diarrhea since his surgery, but feels that his stool is going back to normal consistency finally.  He denies any major incisional pain, but does report some pain going from the hip area towards the incision.   Vital signs: BP 129/79   Pulse 62   Temp 97.8 F (36.6 C) (Oral)   Wt 90.7 kg (200 lb)   BMI 32.28 kg/m    Physical Exam: Constitutional: No acute distress Abdomen:  Soft, nondistended, with mild discomfort to palpation over the right inguinal incision and towards the right ASIS.  Incision is clean, dry, and intact with no evidence of infection or seroma.  Assessment/Plan: 83 yo male s/p right inguinal hernia repair.  --Patient currently healing well.  May have some irritation to ilioinguinal nerve causing the discomfort, but this has been improving.  Reassured patient that if this is just irritation to the nerve, this will continue to improve.   --Patient still has 4 weeks of no heavy lifting or pushing.  Discussed with patient that prior to driving, he should sit on the driver's seat and turn right/left and check how his wound reacts and how much pain he still has.  If there's no significant discomfort that he can do all the motions without issues, then he can start driving again. --Patient may follow up on an as needed basis.   Howie Ill, MD [facility]

## 2017-04-09 NOTE — Patient Instructions (Signed)
GENERAL POST-OPERATIVE PATIENT INSTRUCTIONS   WOUND CARE INSTRUCTIONS:  Keep a dry clean dressing on the wound if there is drainage. The initial bandage may be removed after 24 hours.  Once the wound has quit draining you may leave it open to air.  If clothing rubs against the wound or causes irritation and the wound is not draining you may cover it with a dry dressing during the daytime.  Try to keep the wound dry and avoid ointments on the wound unless directed to do so.  If the wound becomes bright red and painful or starts to drain infected material that is not clear, please contact your physician immediately.  If the wound is mildly pink and has a thick firm ridge underneath it, this is normal, and is referred to as a healing ridge.  This will resolve over the next 4-6 weeks.  BATHING: You may shower if you have been informed of this by your surgeon. However, Please do not submerge in a tub, hot tub, or pool until incisions are completely sealed or have been told by your surgeon that you may do so.  DIET:  You may eat any foods that you can tolerate.  It is a good idea to eat a high fiber diet and take in plenty of fluids to prevent constipation.  If you do become constipated you may want to take a mild laxative or take ducolax tablets on a daily basis until your bowel habits are regular.  Constipation can be very uncomfortable, along with straining, after recent surgery.  ACTIVITY:  You are encouraged to cough and deep breath or use your incentive spirometer if you were given one, every 15-30 minutes when awake.  This will help prevent respiratory complications and low grade fevers post-operatively if you had a general anesthetic.  You may want to hug a pillow when coughing and sneezing to add additional support to the surgical area, if you had abdominal or chest surgery, which will decrease pain during these times.  You are encouraged to walk and engage in light activity for the next two weeks.  You  should not lift more than 20 pounds, until 05/07/2017 as it could put you at increased risk for complications.  Twenty pounds is roughly equivalent to a plastic bag of groceries. At that time- Listen to your body when lifting, if you have pain when lifting, stop and then try again in a few days. Soreness after doing exercises or activities of daily living is normal as you get back in to your normal routine.  QUESTIONS:  Please feel free to call our office if you have any questions, and we will be glad to assist you. [phone]

## 2017-06-06 ENCOUNTER — Emergency Department
Admission: EM | Admit: 2017-06-06 | Discharge: 2017-06-06 | Disposition: A | Discharge status: Home / Self Care | Payer: Medicare Other | Attending: Emergency Medicine | Admitting: Emergency Medicine

## 2017-06-06 ENCOUNTER — Encounter: Payer: Self-pay | Admitting: *Deleted

## 2017-06-06 ENCOUNTER — Emergency Department: Payer: Medicare Other

## 2017-06-06 ENCOUNTER — Ambulatory Visit (INDEPENDENT_AMBULATORY_CARE_PROVIDER_SITE_OTHER)
Admission: EM | Admit: 2017-06-06 | Discharge: 2017-06-06 | Disposition: A | Discharge status: Other Acute Inpatient Hospital | Payer: Medicare Other | Source: Home / Self Care | Attending: Family Medicine | Admitting: Family Medicine

## 2017-06-06 ENCOUNTER — Encounter: Payer: Self-pay | Admitting: Emergency Medicine

## 2017-06-06 DIAGNOSIS — R066 Hiccough: Secondary | ICD-10-CM | POA: Insufficient documentation

## 2017-06-06 DIAGNOSIS — I252 Old myocardial infarction: Secondary | ICD-10-CM | POA: Insufficient documentation

## 2017-06-06 DIAGNOSIS — R778 Other specified abnormalities of plasma proteins: Secondary | ICD-10-CM

## 2017-06-06 DIAGNOSIS — I2510 Atherosclerotic heart disease of native coronary artery without angina pectoris: Secondary | ICD-10-CM | POA: Diagnosis not present

## 2017-06-06 DIAGNOSIS — Z7901 Long term (current) use of anticoagulants: Secondary | ICD-10-CM | POA: Insufficient documentation

## 2017-06-06 DIAGNOSIS — Z79899 Other long term (current) drug therapy: Secondary | ICD-10-CM

## 2017-06-06 DIAGNOSIS — R7989 Other specified abnormal findings of blood chemistry: Secondary | ICD-10-CM | POA: Insufficient documentation

## 2017-06-06 DIAGNOSIS — J449 Chronic obstructive pulmonary disease, unspecified: Secondary | ICD-10-CM | POA: Insufficient documentation

## 2017-06-06 DIAGNOSIS — R748 Abnormal levels of other serum enzymes: Secondary | ICD-10-CM

## 2017-06-06 DIAGNOSIS — J40 Bronchitis, not specified as acute or chronic: Secondary | ICD-10-CM

## 2017-06-06 DIAGNOSIS — Z885 Allergy status to narcotic agent status: Secondary | ICD-10-CM

## 2017-06-06 LAB — COMPREHENSIVE METABOLIC PANEL
ALBUMIN: 4.1 g/dL (ref 3.5–5.0)
ALBUMIN: 4.1 g/dL (ref 3.5–5.0)
ALK PHOS: 67 U/L (ref 38–126)
ALK PHOS: 72 U/L (ref 38–126)
ALT: 19 U/L (ref 17–63)
ALT: 19 U/L (ref 17–63)
ANION GAP: 8 (ref 5–15)
ANION GAP: 10 (ref 5–15)
AST: 31 U/L (ref 15–41)
AST: 33 U/L (ref 15–41)
BUN: 30 mg/dL — ABNORMAL HIGH (ref 6–20)
BUN: 31 mg/dL — AB (ref 6–20)
CALCIUM: 9.4 mg/dL (ref 8.9–10.3)
CO2: 24 mmol/L (ref 22–32)
CO2: 22 mmol/L (ref 22–32)
Calcium: 9.7 mg/dL (ref 8.9–10.3)
Chloride: 102 mmol/L (ref 101–111)
Chloride: 105 mmol/L (ref 101–111)
Creatinine, Ser: 1.08 mg/dL (ref 0.61–1.24)
Creatinine, Ser: 1.05 mg/dL (ref 0.61–1.24)
GFR calc Af Amer: >60 mL/min (ref >60)
GFR calc non Af Amer: >60 mL/min (ref >60)
GFR calc non Af Amer: >60 mL/min (ref >60)
GLUCOSE: 132 mg/dL — AB (ref 65–99)
GLUCOSE: 131 mg/dL — AB (ref 65–99)
POTASSIUM: 4.1 mmol/L (ref 3.5–5.1)
POTASSIUM: 4.6 mmol/L (ref 3.5–5.1)
SODIUM: 134 mmol/L — AB (ref 135–145)
SODIUM: 137 mmol/L (ref 135–145)
TOTAL PROTEIN: 7.7 g/dL (ref 6.5–8.1)
Total Bilirubin: 0.4 mg/dL (ref 0.3–1.2)
Total Bilirubin: 0.6 mg/dL (ref 0.3–1.2)
Total Protein: 7.8 g/dL (ref 6.5–8.1)

## 2017-06-06 LAB — CBC WITH DIFFERENTIAL/PLATELET
Basophils Absolute: 0.1 10*3/uL (ref 0–0.1)
Basophils Relative: 1 %
EOS PCT: 0 %
Eosinophils Absolute: 0 10*3/uL (ref 0–0.7)
HEMATOCRIT: 43.6 % (ref 40.0–52.0)
Hemoglobin: 14.7 g/dL (ref 13.0–18.0)
LYMPHS PCT: 5 %
Lymphs Abs: 0.8 10*3/uL — ABNORMAL LOW (ref 1.0–3.6)
MCH: 33.1 pg (ref 26.0–34.0)
MCHC: 33.8 g/dL (ref 32.0–36.0)
MCV: 97.9 fL (ref 80.0–100.0)
MONO ABS: 0.4 10*3/uL (ref 0.2–1.0)
MONOS PCT: 3 %
NEUTROS ABS: 14.8 10*3/uL — AB (ref 1.4–6.5)
Neutrophils Relative %: 91 %
Platelets: 205 10*3/uL (ref 150–440)
RBC: 4.45 MIL/uL (ref 4.40–5.90)
RDW: 14.3 % (ref 11.5–14.5)
WBC: 16.1 10*3/uL — ABNORMAL HIGH (ref 3.8–10.6)

## 2017-06-06 LAB — CBC
HCT: 42.3 % (ref 40.0–52.0)
HEMOGLOBIN: 14.5 g/dL (ref 13.0–18.0)
MCH: 33.2 pg (ref 26.0–34.0)
MCHC: 34.3 g/dL (ref 32.0–36.0)
MCV: 97 fL (ref 80.0–100.0)
Platelets: 210 10*3/uL (ref 150–440)
RBC: 4.36 MIL/uL — ABNORMAL LOW (ref 4.40–5.90)
RDW: 14.1 % (ref 11.5–14.5)
WBC: 15.3 10*3/uL — ABNORMAL HIGH (ref 3.8–10.6)

## 2017-06-06 LAB — TROPONIN I
Troponin I: 0.04 ng/mL (ref <0.03)
Troponin I: 0.03 ng/mL (ref <0.03)

## 2017-06-06 NOTE — ED Triage Notes (Signed)
Pt went to urgent care for hiccups that were not resolving. While there, the hiccups stopped. Pt was found at UC to have elevated troponin. Pt has frequent intermittent chest pain. Pt states radiation of pain to back but denies other sxs. Pt presently in no acute distress.

## 2017-06-06 NOTE — Discharge Instructions (Signed)
Please follow-up with your primary care physician and your cardiologist as needed.  Use your nebulizer as needed for hiccups and shortness of breath.  It was a pleasure to take care of you today, and thank you for coming to our emergency department.  If you have any questions or concerns before leaving please ask the nurse to grab me and I'm more than happy to go through your aftercare instructions again.  If you were prescribed any opioid pain medication today such as Norco, Vicodin, Percocet, morphine, hydrocodone, or oxycodone please make sure you do not drive when you are taking this medication as it can alter your ability to drive safely.  If you have any concerns once you are home that you are not improving or are in fact getting worse before you can make it to your follow-up appointment, please do not hesitate to call [phone] and come back for further evaluation.  Merrily BrittleNeil Junelle Hashemi, MD  Results for orders placed or performed during the hospital encounter of 06/06/17  Comprehensive metabolic panel  Result Value Ref Range   Sodium 137 135 - 145 mmol/L   Potassium 4.6 3.5 - 5.1 mmol/L   Chloride 105 101 - 111 mmol/L   CO2 22 22 - 32 mmol/L   Glucose, Bld 131 (H) 65 - 99 mg/dL   BUN 31 (H) 6 - 20 mg/dL   Creatinine, Ser 8.411.05 0.61 - 1.24 mg/dL   Calcium 9.7 8.9 - 32.410.3 mg/dL   Total Protein 7.8 6.5 - 8.1 g/dL   Albumin 4.1 3.5 - 5.0 g/dL   AST 33 15 - 41 U/L   ALT 19 17 - 63 U/L   Alkaline Phosphatase 72 38 - 126 U/L   Total Bilirubin 0.6 0.3 - 1.2 mg/dL   GFR calc non Af Amer >60 >60 mL/min   GFR calc Af Amer >60 >60 mL/min   Anion gap 10 5 - 15  CBC with Differential  Result Value Ref Range   WBC 16.1 (H) 3.8 - 10.6 K/uL   RBC 4.45 4.40 - 5.90 MIL/uL   Hemoglobin 14.7 13.0 - 18.0 g/dL   HCT 40.143.6 02.740.0 - 25.352.0 %   MCV 97.9 80.0 - 100.0 fL   MCH 33.1 26.0 - 34.0 pg   MCHC 33.8 32.0 - 36.0 g/dL   RDW 66.414.3 40.311.5 - 47.414.5 %   Platelets 205 150 - 440 K/uL   Neutrophils Relative % 91 %   Neutro Abs 14.8 (H) 1.4 - 6.5 K/uL   Lymphocytes Relative 5 %   Lymphs Abs 0.8 (L) 1.0 - 3.6 K/uL   Monocytes Relative 3 %   Monocytes Absolute 0.4 0.2 - 1.0 K/uL   Eosinophils Relative 0 %   Eosinophils Absolute 0.0 0 - 0.7 K/uL   Basophils Relative 1 %   Basophils Absolute 0.1 0 - 0.1 K/uL  Troponin I  Result Value Ref Range   Troponin I 0.03 (HH) <0.03 ng/mL   Dg Chest Port 1 View  Result Date: 06/06/2017 CLINICAL DATA:  Hiccups, elevated troponin.  History of COPD. EXAM: PORTABLE CHEST 1 VIEW COMPARISON:  Chest radiograph March 21, 2017 FINDINGS: Borderline cardiomegaly. Mediastinal silhouette is nonsuspicious, mildly calcified aortic knob. Mild bronchitic changes with LEFT lung base strandy densities. No pleural effusion or focal consolidation. No pneumothorax. Soft tissue planes and included osseous structures are nonacute. IMPRESSION: Borderline cardiomegaly.  LEFT lung base atelectasis/ scarring. Mild bronchitic changes. Electronically Signed   By: Awilda Metroourtnay  Bloomer M.D.   On: 06/06/2017  21:23  ° ° °
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
## 2017-06-06 NOTE — Discharge Instructions (Signed)
Go straight to ER. ° °Take care ° °Dr. Aalaiyah Yassin  °

## 2017-06-06 NOTE — ED Notes (Signed)
Date and time results received: 06/06/17 2135 (use smartphrase ".now" to insert current time)  Test: Troponin Critical Value: 0.03  Name of Provider Notified: Dr. Lamont Snowballifenbark  Orders Received? Or Actions Taken?:

## 2017-06-06 NOTE — ED Triage Notes (Signed)
Patient is in today c/o excessive hiccups since last night.

## 2017-12-17 ENCOUNTER — Emergency Department
Admission: EM | Admit: 2017-12-17 | Discharge: 2017-12-17 | Disposition: A | Discharge status: Home / Self Care | Payer: Medicare Other | Attending: Emergency Medicine | Admitting: Emergency Medicine

## 2017-12-17 ENCOUNTER — Other Ambulatory Visit: Payer: Self-pay

## 2017-12-17 ENCOUNTER — Emergency Department: Payer: Medicare Other

## 2017-12-17 ENCOUNTER — Ambulatory Visit
Admission: EM | Admit: 2017-12-17 | Discharge: 2017-12-17 | Disposition: A | Discharge status: Home / Self Care | Payer: Medicare Other | Source: Home / Self Care | Attending: Family Medicine | Admitting: Family Medicine

## 2017-12-17 ENCOUNTER — Encounter: Payer: Self-pay | Admitting: Emergency Medicine

## 2017-12-17 DIAGNOSIS — I252 Old myocardial infarction: Secondary | ICD-10-CM | POA: Insufficient documentation

## 2017-12-17 DIAGNOSIS — R001 Bradycardia, unspecified: Secondary | ICD-10-CM

## 2017-12-17 DIAGNOSIS — I2510 Atherosclerotic heart disease of native coronary artery without angina pectoris: Secondary | ICD-10-CM | POA: Diagnosis not present

## 2017-12-17 DIAGNOSIS — Y92007 Garden or yard of unspecified non-institutional (private) residence as the place of occurrence of the external cause: Secondary | ICD-10-CM | POA: Insufficient documentation

## 2017-12-17 DIAGNOSIS — J449 Chronic obstructive pulmonary disease, unspecified: Secondary | ICD-10-CM | POA: Insufficient documentation

## 2017-12-17 DIAGNOSIS — F0390 Unspecified dementia without behavioral disturbance: Secondary | ICD-10-CM | POA: Diagnosis not present

## 2017-12-17 DIAGNOSIS — Y939 Activity, unspecified: Secondary | ICD-10-CM | POA: Diagnosis not present

## 2017-12-17 DIAGNOSIS — R0602 Shortness of breath: Secondary | ICD-10-CM | POA: Diagnosis not present

## 2017-12-17 DIAGNOSIS — R220 Localized swelling, mass and lump, head: Secondary | ICD-10-CM | POA: Diagnosis not present

## 2017-12-17 DIAGNOSIS — R55 Syncope and collapse: Secondary | ICD-10-CM

## 2017-12-17 DIAGNOSIS — S0081XA Abrasion of other part of head, initial encounter: Secondary | ICD-10-CM | POA: Diagnosis not present

## 2017-12-17 DIAGNOSIS — Z23 Encounter for immunization: Secondary | ICD-10-CM | POA: Insufficient documentation

## 2017-12-17 DIAGNOSIS — W1839XA Other fall on same level, initial encounter: Secondary | ICD-10-CM | POA: Diagnosis not present

## 2017-12-17 DIAGNOSIS — S098XXA Other specified injuries of head, initial encounter: Secondary | ICD-10-CM | POA: Diagnosis present

## 2017-12-17 DIAGNOSIS — Y999 Unspecified external cause status: Secondary | ICD-10-CM | POA: Diagnosis not present

## 2017-12-17 LAB — URINALYSIS, COMPLETE (UACMP) WITH MICROSCOPIC
Bilirubin Urine: NEGATIVE
GLUCOSE, UA: NEGATIVE mg/dL
HGB URINE DIPSTICK: NEGATIVE
Ketones, ur: NEGATIVE mg/dL
LEUKOCYTES UA: NEGATIVE
Nitrite: NEGATIVE
Protein, ur: NEGATIVE mg/dL
SPECIFIC GRAVITY, URINE: 1.011 (ref 1.005–1.030)
Squamous Epithelial / LPF: NONE SEEN (ref 0–5)
pH: 5 (ref 5.0–8.0)

## 2017-12-17 LAB — COMPREHENSIVE METABOLIC PANEL
ALT: 28 U/L (ref 17–63)
ANION GAP: 6 (ref 5–15)
AST: 32 U/L (ref 15–41)
Albumin: 4 g/dL (ref 3.5–5.0)
Alkaline Phosphatase: 67 U/L (ref 38–126)
BUN: 24 mg/dL — ABNORMAL HIGH (ref 6–20)
CHLORIDE: 102 mmol/L (ref 101–111)
CO2: 28 mmol/L (ref 22–32)
Calcium: 9.1 mg/dL (ref 8.9–10.3)
Creatinine, Ser: 0.96 mg/dL (ref 0.61–1.24)
GFR calc non Af Amer: >60 mL/min (ref >60)
Glucose, Bld: 100 mg/dL — ABNORMAL HIGH (ref 65–99)
Potassium: 4.1 mmol/L (ref 3.5–5.1)
SODIUM: 136 mmol/L (ref 135–145)
Total Bilirubin: 0.7 mg/dL (ref 0.3–1.2)
Total Protein: 7.1 g/dL (ref 6.5–8.1)

## 2017-12-17 LAB — TROPONIN I
Troponin I: 0.03 ng/mL (ref <0.03)
Troponin I: 0.03 ng/mL (ref <0.03)

## 2017-12-17 LAB — BRAIN NATRIURETIC PEPTIDE: B NATRIURETIC PEPTIDE 5: 87 pg/mL (ref 0.0–100.0)

## 2017-12-17 LAB — CBC
HCT: 41.2 % (ref 40.0–52.0)
Hemoglobin: 14.1 g/dL (ref 13.0–18.0)
MCH: 33.2 pg (ref 26.0–34.0)
MCHC: 34.2 g/dL (ref 32.0–36.0)
MCV: 97 fL (ref 80.0–100.0)
PLATELETS: 198 10*3/uL (ref 150–440)
RBC: 4.24 MIL/uL — AB (ref 4.40–5.90)
RDW: 14.1 % (ref 11.5–14.5)
WBC: 7.1 10*3/uL (ref 3.8–10.6)

## 2017-12-17 MED ORDER — SODIUM CHLORIDE 0.9 % IV BOLUS: 1000.0000 mL | Freq: Once | INTRAVENOUS | Status: DC

## 2017-12-17 MED ORDER — TETANUS-DIPHTH-ACELL PERTUSSIS 5-2.5-18.5 LF-MCG/0.5 IM SUSP
0.5000 mL | Freq: Once | INTRAMUSCULAR | Status: AC
  Administered 2017-12-17: 0.5 mL via INTRAMUSCULAR
  Filled 2017-12-17: qty 0.5

## 2017-12-17 NOTE — ED Triage Notes (Signed)
Pt comes via [facility] from [facility] with c/o syncope episode earlier today at home and pt fell and hit his head. Pt went to [facility] care to be evaluated and they sent him here because of extensive cardiac hx. Pt states he is currently on blood thinners. Pt is alert. EMS states Vitals BP-147/85, HR-51, 98% room air, BS-114.

## 2017-12-17 NOTE — ED Notes (Signed)
Pt given meal tray and water at this time 

## 2017-12-17 NOTE — ED Notes (Signed)
Esign not working, Pt  Verbalized discharge instructions and has no questions at this time.

## 2017-12-17 NOTE — ED Notes (Signed)
MD at bedside. 
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
## 2017-12-17 NOTE — ED Notes (Signed)
Pt ambulated to toilet without difficulty 

## 2017-12-17 NOTE — ED Notes (Signed)
Patient transported to CT 

## 2017-12-17 NOTE — ED Notes (Signed)
Pt ambulatory to toilet without difficulties 

## 2017-12-17 NOTE — Discharge Instructions (Addendum)
Please apply Neosporin or any triple antibiotic cream to the abrasion on your forehead 3 times daily until it has completely healed.  Please make an appointment with your cardiologist to have your heart rate and your medications reevaluated.  Please return to the emergency department if you develop severe pain, lightheadedness or fainting, chest pain, shortness of breath, palpitations, or any other symptoms concerning to you.

## 2017-12-17 NOTE — ED Triage Notes (Signed)
Patients wife states patient was outside and fainted about 12 today.

## 2018-02-22 ENCOUNTER — Emergency Department: Payer: Medicare Other

## 2018-02-22 ENCOUNTER — Emergency Department
Admission: EM | Admit: 2018-02-22 | Discharge: 2018-02-22 | Disposition: A | Discharge status: Home / Self Care | Payer: Medicare Other | Attending: Emergency Medicine | Admitting: Emergency Medicine

## 2018-02-22 ENCOUNTER — Encounter: Payer: Self-pay | Admitting: Emergency Medicine

## 2018-02-22 ENCOUNTER — Other Ambulatory Visit: Payer: Self-pay

## 2018-02-22 DIAGNOSIS — Z79899 Other long term (current) drug therapy: Secondary | ICD-10-CM | POA: Insufficient documentation

## 2018-02-22 DIAGNOSIS — R079 Chest pain, unspecified: Secondary | ICD-10-CM | POA: Insufficient documentation

## 2018-02-22 DIAGNOSIS — I2510 Atherosclerotic heart disease of native coronary artery without angina pectoris: Secondary | ICD-10-CM | POA: Insufficient documentation

## 2018-02-22 DIAGNOSIS — F0390 Unspecified dementia without behavioral disturbance: Secondary | ICD-10-CM | POA: Diagnosis not present

## 2018-02-22 DIAGNOSIS — J449 Chronic obstructive pulmonary disease, unspecified: Secondary | ICD-10-CM | POA: Diagnosis not present

## 2018-02-22 LAB — CBC
HCT: 40.9 % (ref 40.0–52.0)
Hemoglobin: 14.1 g/dL (ref 13.0–18.0)
MCH: 33.4 pg (ref 26.0–34.0)
MCHC: 34.5 g/dL (ref 32.0–36.0)
MCV: 97 fL (ref 80.0–100.0)
Platelets: 189 10*3/uL (ref 150–440)
RBC: 4.22 MIL/uL — ABNORMAL LOW (ref 4.40–5.90)
RDW: 14 % (ref 11.5–14.5)
WBC: 5.4 10*3/uL (ref 3.8–10.6)

## 2018-02-22 LAB — COMPREHENSIVE METABOLIC PANEL
ALBUMIN: 3.8 g/dL (ref 3.5–5.0)
ALT: 17 U/L (ref 0–44)
AST: 29 U/L (ref 15–41)
Alkaline Phosphatase: 65 U/L (ref 38–126)
Anion gap: 9 (ref 5–15)
BUN: 18 mg/dL (ref 8–23)
CALCIUM: 8.9 mg/dL (ref 8.9–10.3)
CHLORIDE: 105 mmol/L (ref 98–111)
CO2: 26 mmol/L (ref 22–32)
Creatinine, Ser: 0.91 mg/dL (ref 0.61–1.24)
GFR calc non Af Amer: >60 mL/min (ref >60)
GLUCOSE: 161 mg/dL — AB (ref 70–99)
POTASSIUM: 4.1 mmol/L (ref 3.5–5.1)
SODIUM: 140 mmol/L (ref 135–145)
Total Bilirubin: 0.8 mg/dL (ref 0.3–1.2)
Total Protein: 7 g/dL (ref 6.5–8.1)

## 2018-02-22 LAB — TROPONIN I: Troponin I: 0.03 ng/mL (ref <0.03)

## 2018-02-22 MED ORDER — ACETAMINOPHEN 325 MG PO TABS
650.0000 mg | ORAL_TABLET | Freq: Once | ORAL | Status: AC
  Administered 2018-02-22: 650 mg via ORAL

## 2018-02-22 MED ORDER — ACETAMINOPHEN 325 MG PO TABS
ORAL_TABLET | ORAL | Status: AC
  Filled 2018-02-22: qty 2
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
## 2018-02-22 NOTE — ED Notes (Signed)
Date and time results received: 02/22/18 1116   Test: Troponin Critical Value: 0.03 ng/mL  Name of Provider Notified: Jene Everyobert Kinner, MD

## 2018-02-22 NOTE — ED Triage Notes (Signed)
Chest pain began during the night. Took 2 nitro approx 0730 with some relief. Here with daughter who lives in same building.

## 2018-09-28 IMAGING — CR DG CHEST 2V
4 series · 4 of 4 positions shown · non-contrast
Comparison: Radiographs June 08, 2016.

CLINICAL DATA: Weakness.

EXAM:
CHEST  2 VIEW

[chest pa (1 of 2)]
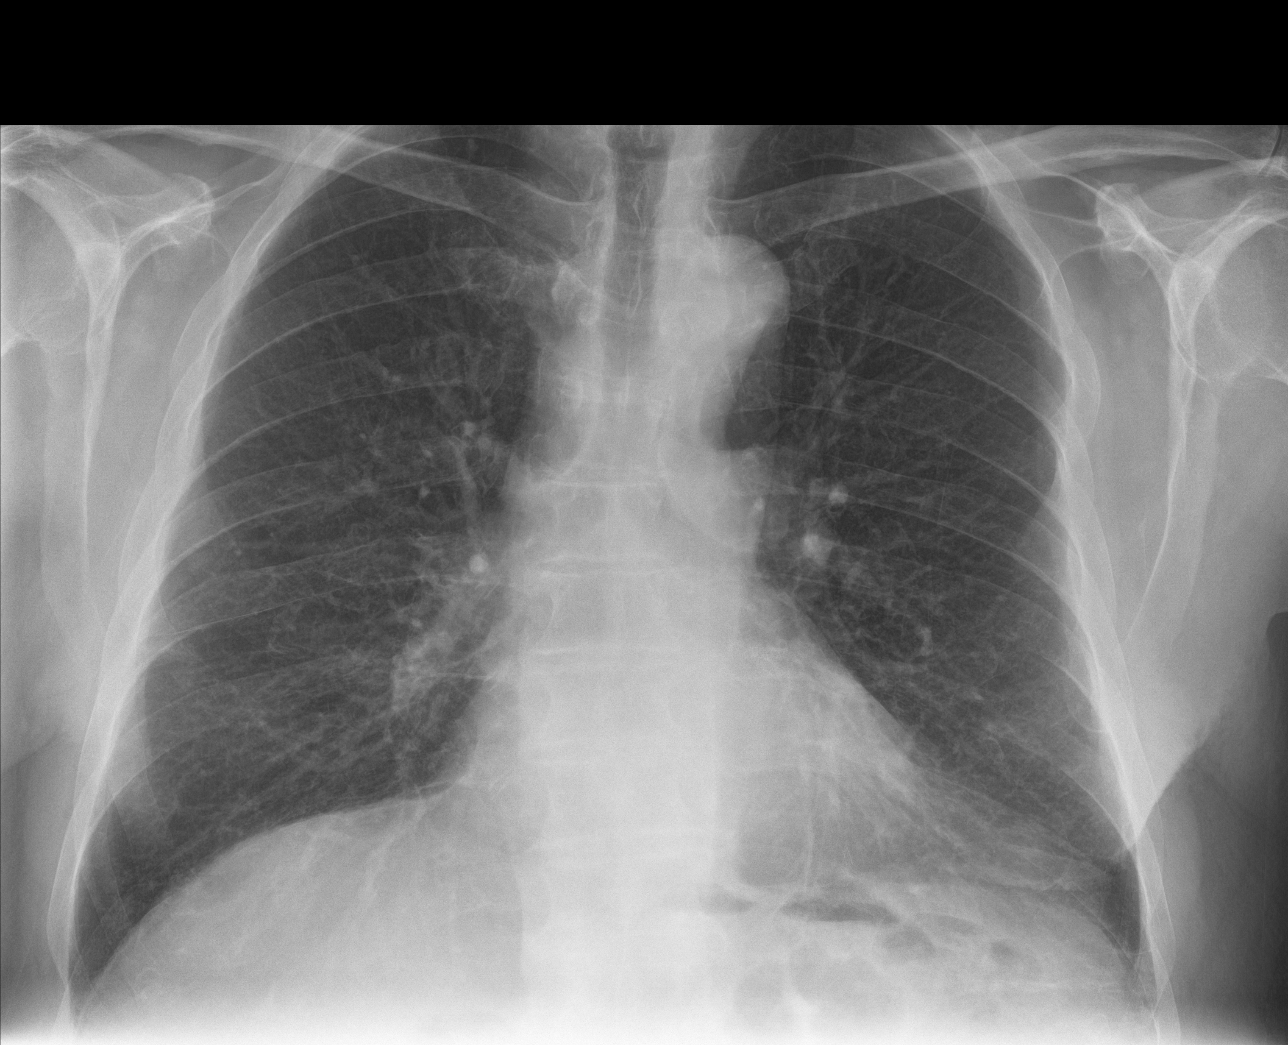

[chest lat (1 of 2)]
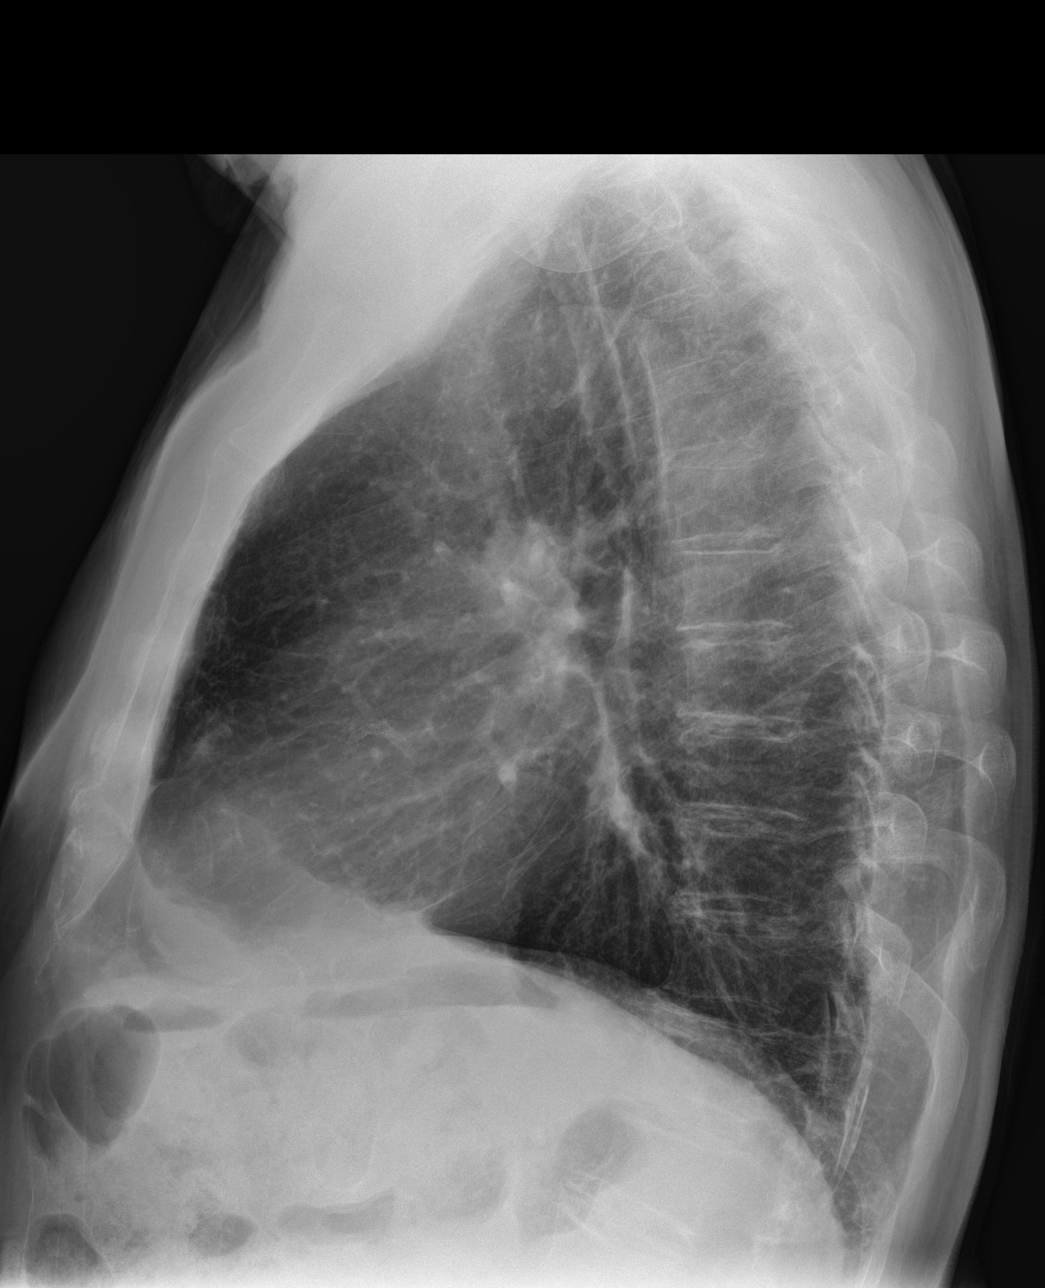

[chest pa (2 of 2)]
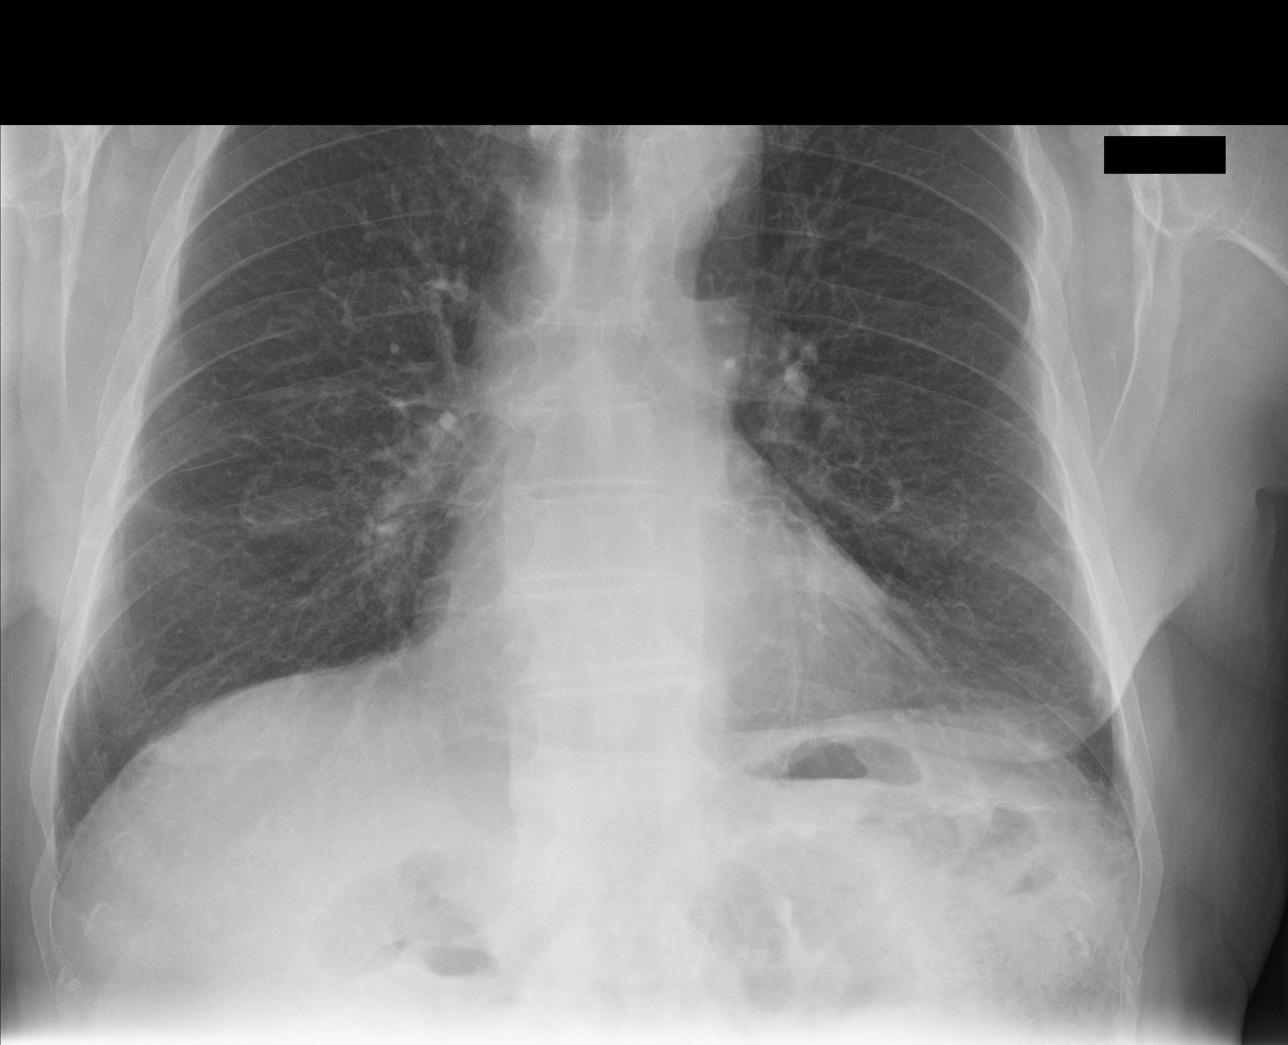

[chest lat (2 of 2)]
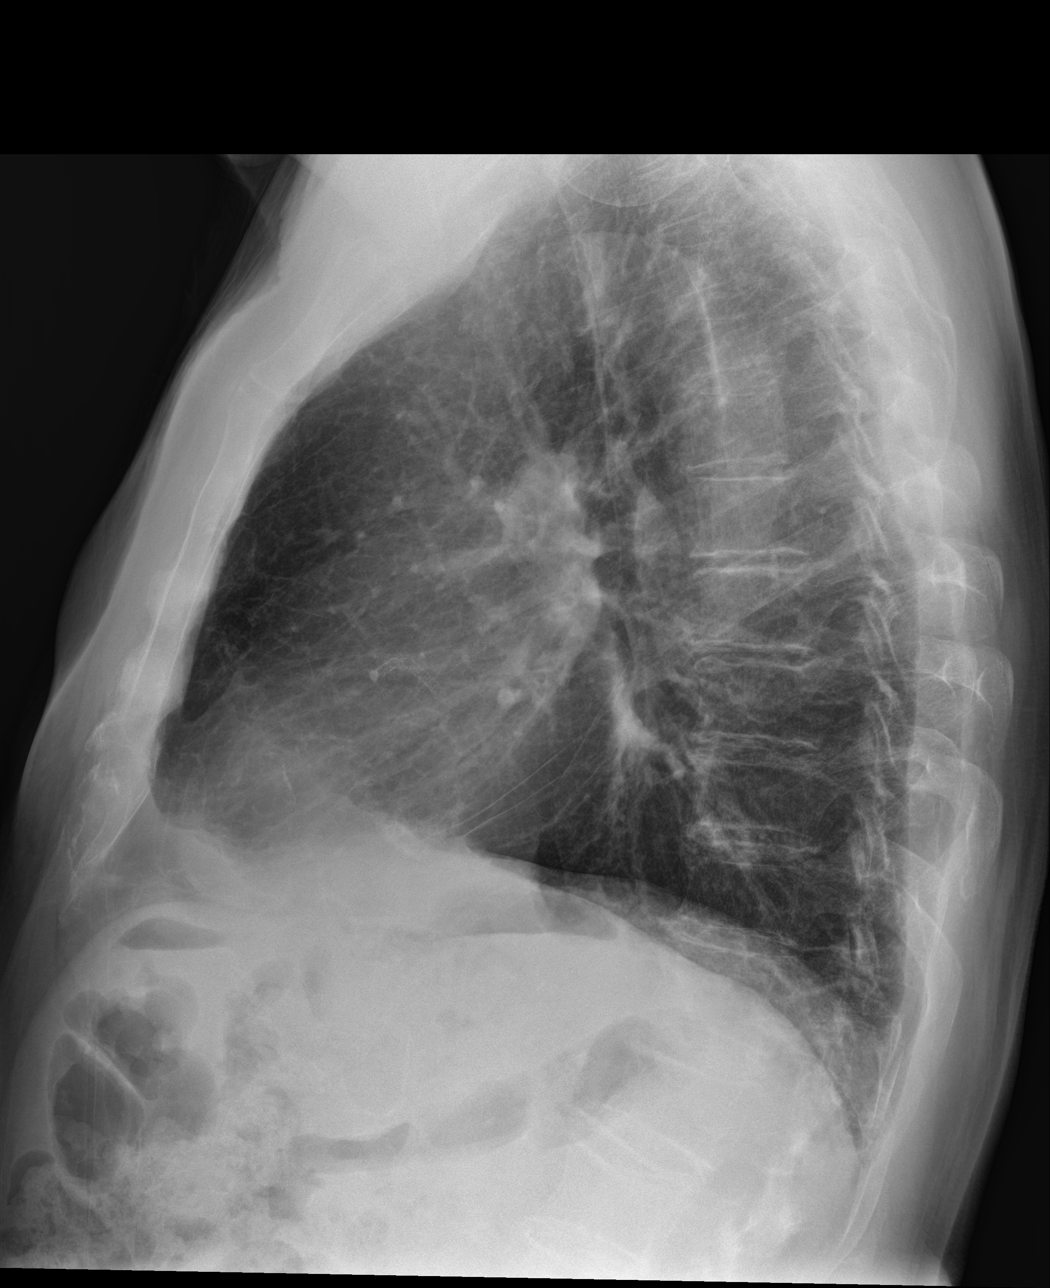

[4 of 4 positions shown; findings below may reference images not displayed]

FINDINGS: The heart size and mediastinal contours are within normal limits.
Both lungs are clear. No pneumothorax or pleural effusion is noted.
The visualized skeletal structures are unremarkable.
IMPRESSION: No active cardiopulmonary disease.

## 2018-10-22 ENCOUNTER — Ambulatory Visit (INDEPENDENT_AMBULATORY_CARE_PROVIDER_SITE_OTHER): Payer: Medicare Other | Admitting: Internal Medicine

## 2018-10-22 ENCOUNTER — Encounter: Payer: Self-pay | Admitting: Internal Medicine

## 2018-10-22 VITALS — BP 150/90 | HR 84 | Ht 67.0 in | Wt 212.6 lb

## 2018-10-22 DIAGNOSIS — G4733 Obstructive sleep apnea (adult) (pediatric): Secondary | ICD-10-CM | POA: Diagnosis not present

## 2018-10-22 MED ORDER — FLUTICASONE FUROATE-VILANTEROL 100-25 MCG/INH IN AEPB: 1.0000 | INHALATION_SPRAY | Freq: Every day | RESPIRATORY_TRACT | 0 refills | Status: DC

## 2018-10-22 MED ORDER — FLUTICASONE FUROATE-VILANTEROL 200-25 MCG/INH IN AEPB: 1.0000 | INHALATION_SPRAY | Freq: Every day | RESPIRATORY_TRACT | 5 refills | Status: DC
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
## 2018-10-22 NOTE — Patient Instructions (Addendum)
Will start Breo inhaler one puff once daily, rinse mouth after use.   Continue CPAP, will give prescription for new supplies. Will need new mask to be fitted.

## 2018-10-23 ENCOUNTER — Institutional Professional Consult (permissible substitution): Payer: Medicare Other | Admitting: Internal Medicine

## 2018-11-04 ENCOUNTER — Telehealth: Payer: Self-pay

## 2018-11-04 NOTE — Telephone Encounter (Signed)
Daughter Malva Cogan called wanting to check status of cpap machine supplies sent to [facility].   Call back # [phone] for

## 2018-11-04 NOTE — Telephone Encounter (Signed)
Note   Confirmation received via CM from Gilda with [employer] that order has been pulled to process. Keith Estes     ______________  Keith Estes and spoke with daughter advised her of above. [facility] can you help with this. Thank you.

## 2018-11-10 NOTE — Telephone Encounter (Signed)
Per Marchelle Folks at [place] pt is enrolled in the call center for supplies. [facility] has been trying to contact patient. Achille Rich to try contacting pt's daughter at the below phone number to advise how she would need to order these supplies. Marchelle Folks will reach out to patient's daughter to explain process. Rhonda J Cobb

## 2018-11-10 NOTE — Telephone Encounter (Signed)
Spoke with pt's daughter and Patsy Lager contacted her to arrange shipment of CPAP supplies.   Nothing else needed at this time. Rhonda J Cobb

## 2018-11-10 NOTE — Telephone Encounter (Signed)
Checking to see if you need anything else on this pt for cpap supplies on this patient.  Glinda with [employer] states the order has been pulled to process.  Rhonda please advise. Thank you.,

## 2019-05-04 IMAGING — CR DG CHEST 2V
2 series · 2 of 2 positions shown · non-contrast
Comparison: August 15, 2016 and June 08, 2016

CLINICAL DATA: Preoperative evaluation for inguinal hernia repair.
History of coronary artery disease

EXAM:
CHEST  2 VIEW

[chest pa]
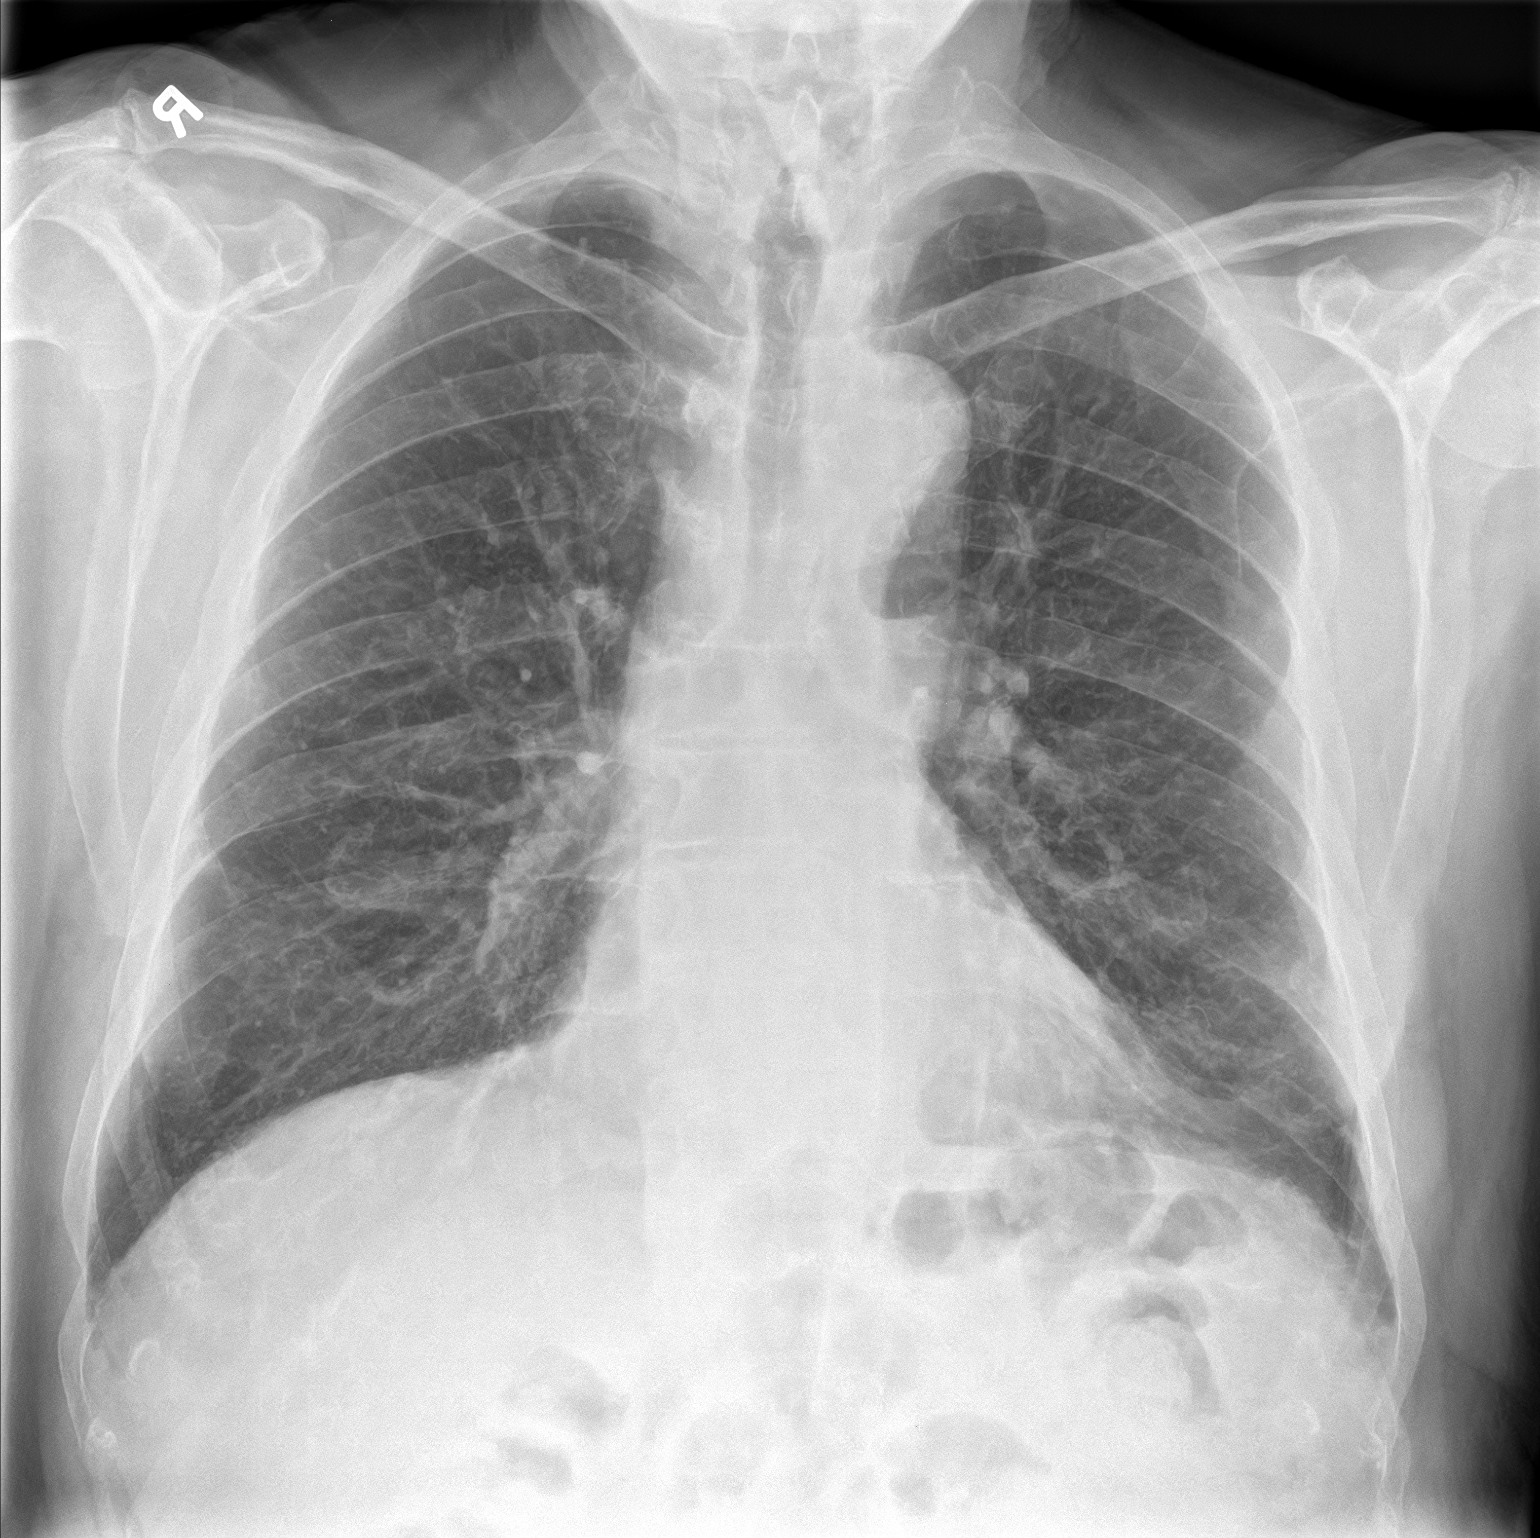

[chest lat]
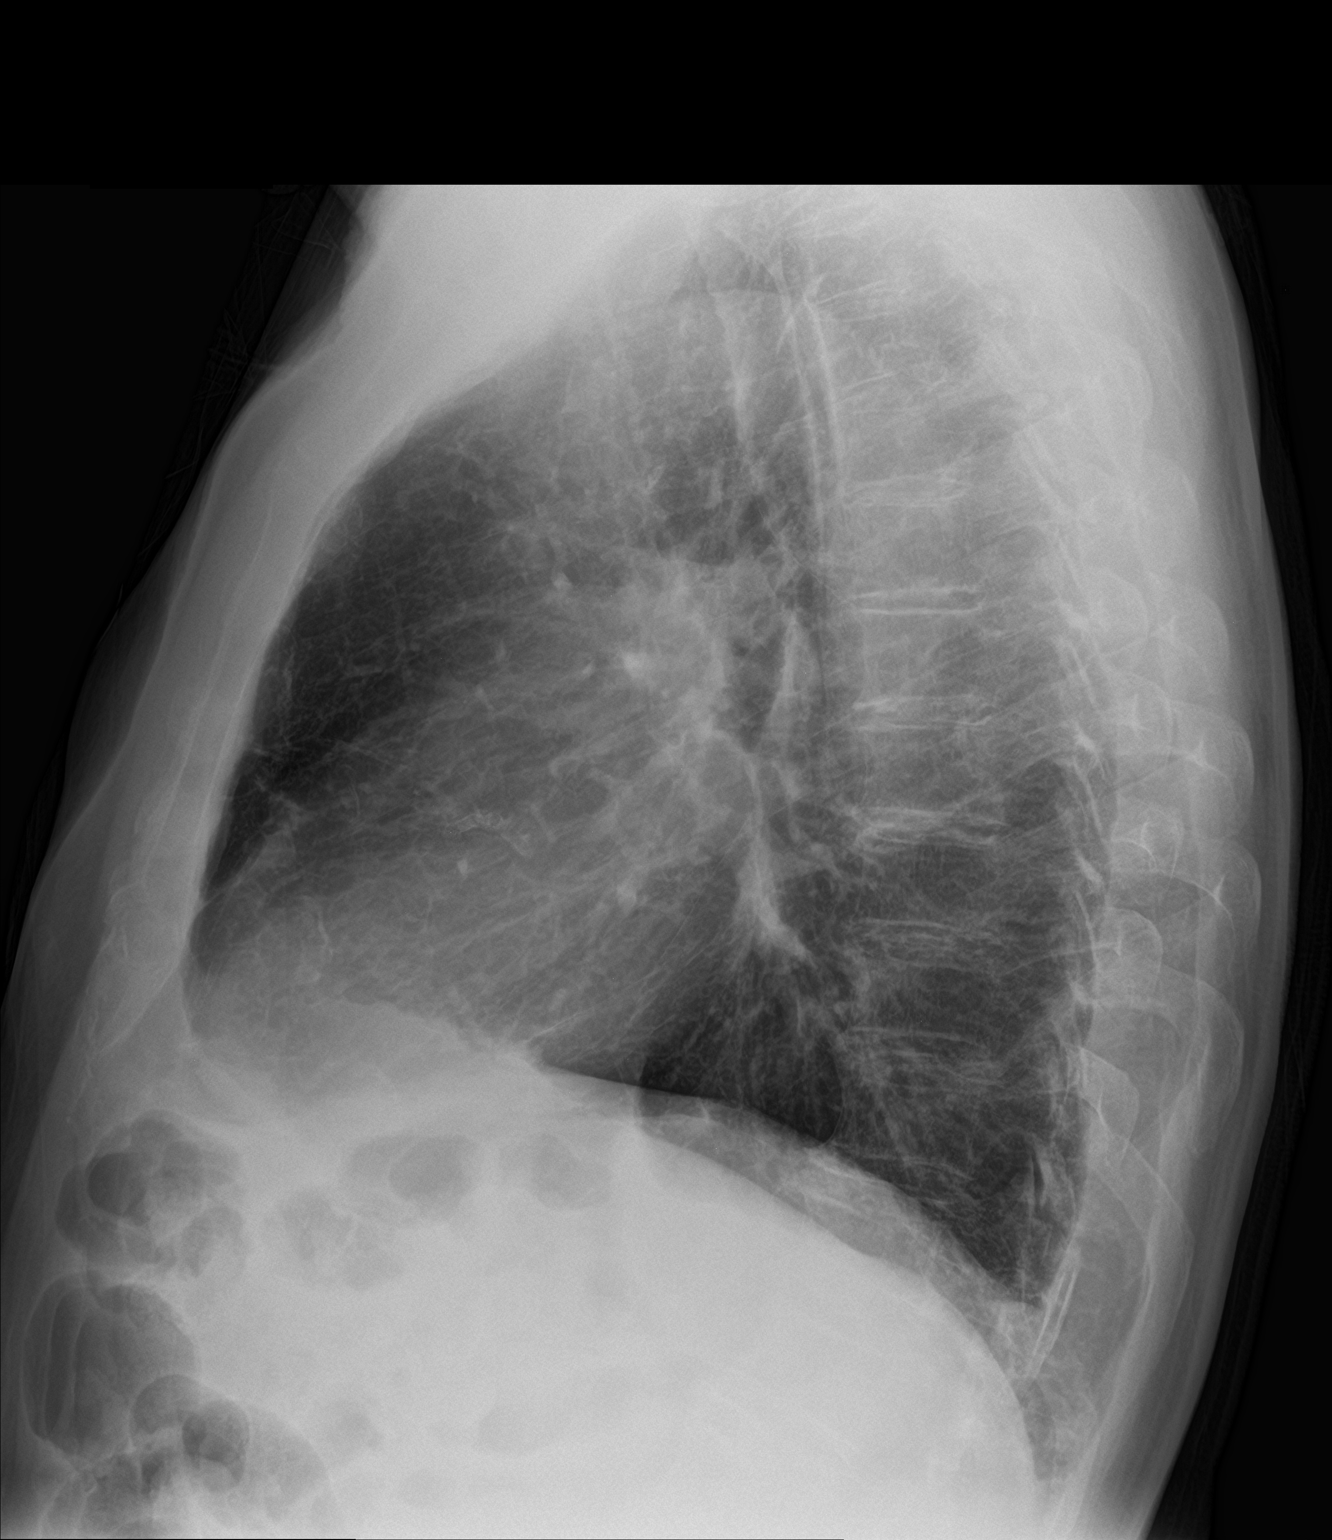

[2 of 2 positions shown; findings below may reference images not displayed]

FINDINGS: There is slight bibasilar scarring. There is no edema or
consolidation. Heart size and pulmonary vascularity are normal. No
adenopathy. There is aortic atherosclerosis. No evident bone
lesions.
IMPRESSION: Slight bibasilar scarring. No edema or consolidation. There is
aortic atherosclerosis.

Aortic Atherosclerosis ([id number]).

## 2019-07-29 ENCOUNTER — Other Ambulatory Visit: Payer: Self-pay

## 2019-07-29 MED ORDER — BREO ELLIPTA 200-25 MCG/INH IN AEPB: 1.0000 | INHALATION_SPRAY | Freq: Every day | RESPIRATORY_TRACT | 0 refills | Status: DC

## 2019-08-24 ENCOUNTER — Other Ambulatory Visit: Payer: Self-pay

## 2019-08-24 MED ORDER — BREO ELLIPTA 200-25 MCG/INH IN AEPB: 1.0000 | INHALATION_SPRAY | Freq: Every day | RESPIRATORY_TRACT | 0 refills | Status: DC

## 2019-08-24 MED ORDER — BREO ELLIPTA 100-25 MCG/INH IN AEPB: 1.0000 | INHALATION_SPRAY | Freq: Every day | RESPIRATORY_TRACT | 0 refills | Status: DC

## 2020-09-14 DIAGNOSIS — I447 Left bundle-branch block, unspecified: Secondary | ICD-10-CM | POA: Insufficient documentation

## 2021-01-28 ENCOUNTER — Encounter: Payer: Self-pay | Admitting: Emergency Medicine

## 2021-01-28 ENCOUNTER — Ambulatory Visit
Admission: EM | Admit: 2021-01-28 | Discharge: 2021-01-28 | Disposition: A | Discharge status: Home / Self Care | Payer: Medicare Other

## 2021-01-28 ENCOUNTER — Other Ambulatory Visit: Payer: Self-pay

## 2021-01-28 DIAGNOSIS — Z8679 Personal history of other diseases of the circulatory system: Secondary | ICD-10-CM | POA: Diagnosis not present

## 2021-01-28 DIAGNOSIS — I252 Old myocardial infarction: Secondary | ICD-10-CM

## 2021-01-28 DIAGNOSIS — I442 Atrioventricular block, complete: Secondary | ICD-10-CM | POA: Diagnosis not present

## 2021-01-28 DIAGNOSIS — R42 Dizziness and giddiness: Secondary | ICD-10-CM

## 2021-01-28 DIAGNOSIS — R0602 Shortness of breath: Secondary | ICD-10-CM

## 2021-01-28 NOTE — ED Triage Notes (Signed)
Patient's daughter states that he bent over last night and felt dizzy and had vision problem in his right eye.  Patient c/o dizziness, SOB, and HA that started this morning.  Patient denies hitting his head and no chest pain.  Patient has history of COPD and Atrial Fib.

## 2021-01-28 NOTE — Discharge Instructions (Addendum)
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
## 2021-01-28 NOTE — ED Notes (Signed)
Patient is being discharged from the Urgent Care and sent to the Emergency Department via EMS. Per Athena Masse, PA, patient is in need of higher level of care due to bradycardia, dizziness, and shortness of breath. Patient is aware and verbalizes understanding of plan of care.  Vitals:   01/28/21 1045  BP: 115/72  Pulse: (!) 33  Resp: 14  SpO2: 97%

## 2021-02-07 DIAGNOSIS — I495 Sick sinus syndrome: Secondary | ICD-10-CM | POA: Insufficient documentation

## 2021-12-17 ENCOUNTER — Observation Stay
Admission: EM | Admit: 2021-12-17 | Discharge: 2021-12-18 | Disposition: A | Discharge status: Home / Self Care | Payer: Medicare Other | Attending: Internal Medicine | Admitting: Internal Medicine

## 2021-12-17 ENCOUNTER — Other Ambulatory Visit: Payer: Self-pay

## 2021-12-17 DIAGNOSIS — I2510 Atherosclerotic heart disease of native coronary artery without angina pectoris: Secondary | ICD-10-CM | POA: Insufficient documentation

## 2021-12-17 DIAGNOSIS — Z20822 Contact with and (suspected) exposure to covid-19: Secondary | ICD-10-CM | POA: Insufficient documentation

## 2021-12-17 DIAGNOSIS — Z950 Presence of cardiac pacemaker: Secondary | ICD-10-CM | POA: Insufficient documentation

## 2021-12-17 DIAGNOSIS — R441 Visual hallucinations: Secondary | ICD-10-CM | POA: Diagnosis present

## 2021-12-17 DIAGNOSIS — F03911 Unspecified dementia, unspecified severity, with agitation: Secondary | ICD-10-CM | POA: Diagnosis not present

## 2021-12-17 DIAGNOSIS — R410 Disorientation, unspecified: Secondary | ICD-10-CM | POA: Diagnosis not present

## 2021-12-17 DIAGNOSIS — F0394 Unspecified dementia, unspecified severity, with anxiety: Secondary | ICD-10-CM | POA: Diagnosis not present

## 2021-12-17 DIAGNOSIS — K219 Gastro-esophageal reflux disease without esophagitis: Secondary | ICD-10-CM | POA: Insufficient documentation

## 2021-12-17 DIAGNOSIS — M6281 Muscle weakness (generalized): Secondary | ICD-10-CM | POA: Diagnosis not present

## 2021-12-17 DIAGNOSIS — F0390 Unspecified dementia without behavioral disturbance: Secondary | ICD-10-CM | POA: Insufficient documentation

## 2021-12-17 DIAGNOSIS — I10 Essential (primary) hypertension: Secondary | ICD-10-CM | POA: Diagnosis not present

## 2021-12-17 DIAGNOSIS — F0392 Unspecified dementia, unspecified severity, with psychotic disturbance: Secondary | ICD-10-CM | POA: Insufficient documentation

## 2021-12-17 DIAGNOSIS — R2689 Other abnormalities of gait and mobility: Secondary | ICD-10-CM | POA: Diagnosis not present

## 2021-12-17 DIAGNOSIS — I480 Paroxysmal atrial fibrillation: Secondary | ICD-10-CM | POA: Insufficient documentation

## 2021-12-17 DIAGNOSIS — R443 Hallucinations, unspecified: Principal | ICD-10-CM

## 2021-12-17 DIAGNOSIS — Z7901 Long term (current) use of anticoagulants: Secondary | ICD-10-CM | POA: Diagnosis not present

## 2021-12-17 DIAGNOSIS — Z8249 Family history of ischemic heart disease and other diseases of the circulatory system: Secondary | ICD-10-CM | POA: Diagnosis not present

## 2021-12-17 DIAGNOSIS — Z79899 Other long term (current) drug therapy: Secondary | ICD-10-CM | POA: Diagnosis not present

## 2021-12-17 LAB — URINALYSIS, ROUTINE W REFLEX MICROSCOPIC
Bilirubin Urine: NEGATIVE
Glucose, UA: NEGATIVE mg/dL
Hgb urine dipstick: NEGATIVE
Ketones, ur: NEGATIVE mg/dL
Leukocytes,Ua: NEGATIVE
Nitrite: NEGATIVE
Protein, ur: NEGATIVE mg/dL
Specific Gravity, Urine: 1.015 (ref 1.005–1.030)
pH: 5 (ref 5.0–8.0)

## 2021-12-17 LAB — COMPREHENSIVE METABOLIC PANEL
ALT: 17 U/L (ref 0–44)
AST: 28 U/L (ref 15–41)
Albumin: 3.9 g/dL (ref 3.5–5.0)
Alkaline Phosphatase: 79 U/L (ref 38–126)
Anion gap: 8 (ref 5–15)
BUN: 22 mg/dL (ref 8–23)
CO2: 28 mmol/L (ref 22–32)
Calcium: 9.3 mg/dL (ref 8.9–10.3)
Chloride: 106 mmol/L (ref 98–111)
Creatinine, Ser: 1.11 mg/dL (ref 0.61–1.24)
GFR, Estimated: >60 mL/min (ref >60)
Glucose, Bld: 125 mg/dL — ABNORMAL HIGH (ref 70–99)
Potassium: 3.5 mmol/L (ref 3.5–5.1)
Sodium: 142 mmol/L (ref 135–145)
Total Bilirubin: 0.9 mg/dL (ref 0.3–1.2)
Total Protein: 7.4 g/dL (ref 6.5–8.1)

## 2021-12-17 LAB — CBC WITH DIFFERENTIAL/PLATELET
Abs Immature Granulocytes: 0.02 10*3/uL (ref 0.00–0.07)
Basophils Absolute: 0 10*3/uL (ref 0.0–0.1)
Basophils Relative: 1 %
Eosinophils Absolute: 0.3 10*3/uL (ref 0.0–0.5)
Eosinophils Relative: 5 %
HCT: 36.6 % — ABNORMAL LOW (ref 39.0–52.0)
Hemoglobin: 11.5 g/dL — ABNORMAL LOW (ref 13.0–17.0)
Immature Granulocytes: 0 %
Lymphocytes Relative: 29 %
Lymphs Abs: 1.6 10*3/uL (ref 0.7–4.0)
MCH: 30.4 pg (ref 26.0–34.0)
MCHC: 31.4 g/dL (ref 30.0–36.0)
MCV: 96.8 fL (ref 80.0–100.0)
Monocytes Absolute: 0.6 10*3/uL (ref 0.1–1.0)
Monocytes Relative: 11 %
Neutro Abs: 3 10*3/uL (ref 1.7–7.7)
Neutrophils Relative %: 54 %
Platelets: 209 10*3/uL (ref 150–400)
RBC: 3.78 MIL/uL — ABNORMAL LOW (ref 4.22–5.81)
RDW: 14.9 % (ref 11.5–15.5)
WBC: 5.4 10*3/uL (ref 4.0–10.5)
nRBC: 0 % (ref 0.0–0.2)

## 2021-12-17 LAB — T4, FREE: Free T4: 1.04 ng/dL (ref 0.61–1.12)

## 2021-12-17 LAB — TSH: TSH: 1.997 u[IU]/mL (ref 0.350–4.500)

## 2021-12-17 LAB — RESP PANEL BY RT-PCR (FLU A&B, COVID) ARPGX2
Influenza A by PCR: NEGATIVE
Influenza B by PCR: NEGATIVE
SARS Coronavirus 2 by RT PCR: NEGATIVE

## 2021-12-17 LAB — TROPONIN I (HIGH SENSITIVITY)
Troponin I (High Sensitivity): 38 ng/L — ABNORMAL HIGH (ref <18)
Troponin I (High Sensitivity): 42 ng/L — ABNORMAL HIGH (ref <18)

## 2021-12-17 MED ORDER — SODIUM CHLORIDE 0.9% FLUSH
3.0000 mL | Freq: Two times a day (BID) | INTRAVENOUS | Status: DC
  Administered 2021-12-17 – 2021-12-18 (×2): 3 mL via INTRAVENOUS

## 2021-12-17 MED ORDER — POLYETHYLENE GLYCOL 3350 17 G PO PACK: 17.0000 g | PACK | Freq: Every day | ORAL | Status: DC | PRN

## 2021-12-17 MED ORDER — DONEPEZIL HCL 5 MG PO TABS
10.0000 mg | ORAL_TABLET | Freq: Every day | ORAL | Status: DC
  Administered 2021-12-17: 10 mg via ORAL
  Filled 2021-12-17: qty 2

## 2021-12-17 MED ORDER — TAMSULOSIN HCL 0.4 MG PO CAPS
0.4000 mg | ORAL_CAPSULE | Freq: Every day | ORAL | Status: DC
  Administered 2021-12-17: 0.4 mg via ORAL
  Filled 2021-12-17: qty 1

## 2021-12-17 MED ORDER — APIXABAN 5 MG PO TABS
5.0000 mg | ORAL_TABLET | Freq: Two times a day (BID) | ORAL | Status: DC
  Administered 2021-12-17 – 2021-12-18 (×2): 5 mg via ORAL
  Filled 2021-12-17 (×2): qty 1

## 2021-12-17 MED ORDER — ACETAMINOPHEN 650 MG RE SUPP: 650.0000 mg | Freq: Four times a day (QID) | RECTAL | Status: DC | PRN

## 2021-12-17 MED ORDER — SODIUM CHLORIDE 0.9% FLUSH: 3.0000 mL | INTRAVENOUS | Status: DC | PRN

## 2021-12-17 MED ORDER — TORSEMIDE 20 MG PO TABS
10.0000 mg | ORAL_TABLET | Freq: Every day | ORAL | Status: DC
  Administered 2021-12-18: 10 mg via ORAL
  Filled 2021-12-17: qty 1

## 2021-12-17 MED ORDER — ACETAMINOPHEN 325 MG PO TABS
650.0000 mg | ORAL_TABLET | Freq: Four times a day (QID) | ORAL | Status: DC | PRN
  Administered 2021-12-18: 650 mg via ORAL
  Filled 2021-12-17: qty 2

## 2021-12-17 MED ORDER — SODIUM CHLORIDE 0.9 % IV SOLN: 250.0000 mL | INTRAVENOUS | Status: DC | PRN

## 2021-12-17 MED ORDER — PANTOPRAZOLE SODIUM 40 MG PO TBEC
40.0000 mg | DELAYED_RELEASE_TABLET | Freq: Every day | ORAL | Status: DC
  Administered 2021-12-18: 40 mg via ORAL
  Filled 2021-12-17: qty 1

## 2021-12-17 MED ORDER — CARVEDILOL 6.25 MG PO TABS
6.2500 mg | ORAL_TABLET | Freq: Two times a day (BID) | ORAL | Status: DC
  Administered 2021-12-18: 6.25 mg via ORAL
  Filled 2021-12-17: qty 1

## 2021-12-17 MED ORDER — TRAZODONE HCL 50 MG PO TABS
100.0000 mg | ORAL_TABLET | Freq: Every day | ORAL | Status: DC
  Administered 2021-12-17: 100 mg via ORAL
  Filled 2021-12-17: qty 2

## 2021-12-17 NOTE — ED Provider Notes (Signed)
? ?[facility] ?Provider Note ? ? ? Event Date/Time  ? First MD Initiated Contact with Patient 12/17/21 1503   ?  (approximate) ? ? ?History  ? ?Hallucinations ? ? ?HPI ? ?Keith Estes is a 88 y.o. male patient with dementia, pacemaker, paroxysmal A-fib on Eliquis who comes in with hallucinations.  Hallucinations include crawling around the floor and seeing things that no one else sees.  Patient was started on Aricept for years but was recently started on Seroquel at nighttime and taken off the Aricept is already on trazodone at nighttime.  Family report that this is been progressively worsening over the past week to the point where he had an unresponsive episode yesterday and was seen at [facility].  However last night he continued to have hallucinations.  With this confusion he has been nonambulatory and not acting his normal self.  He seems much more confused than his baseline. ? ?On review of records patient was seen at the [facility] emergency room yesterday for dehydration.  They got a liter of fluid.  Patient had a CT head that was negative ? ?Physical Exam  ? ?Triage Vital Signs: ?ED Triage Vitals  ?Enc Vitals Group  ?   BP 12/17/21 1445 (!) 113/98  ?   Pulse Rate 12/17/21 1443 70  ?   Resp 12/17/21 1443 19  ?   Temp 12/17/21 1443 (!) 97.4 ?F (36.3 ?C)  ?   Temp src --   ?   SpO2 12/17/21 1439 98 %  ?   Weight 12/17/21 1444 188 lb 11.4 oz (85.6 kg)  ?   Height --   ?   Head Circumference --   ?   Peak Flow --   ?   Pain Score 12/17/21 1443 0  ?   Pain Loc --   ?   Pain Edu? --   ?   Excl. in GC? --   ? ? ?Most recent vital signs: ?Vitals:  ? 12/17/21 1443 12/17/21 1445  ?BP:  (!) 113/98  ?Pulse: 70 70  ?Resp: 19 19  ?Temp: (!) 97.4 ?F (36.3 ?C)   ?SpO2: 98% 100%  ? ? ? ?General: Awake, no distress.  ?CV:  Good peripheral perfusion.  ?Resp:  Normal effort.  ?Abd:  No distention.  Soft and nontender ?Other:  Patient appears confused trying to bite a Band-Aid off of his finger after multiple  times being told not to.  However he is alert and oriented x2 he is got good grip strength.  Equal strength in his legs.  Cranial nerves appear intact otherwise events of confusion ? ? ?ED Results / Procedures / Treatments  ? ?Labs ?(all labs ordered are listed, but only abnormal results are displayed) ?Labs Reviewed  ?CBC WITH DIFFERENTIAL/PLATELET - Abnormal; Notable for the following components:  ?    Result Value  ? RBC 3.78 (*)   ? Hemoglobin 11.5 (*)   ? HCT 36.6 (*)   ? All other components within normal limits  ?RESP PANEL BY RT-PCR (FLU A&B, COVID) [id number]  ?COMPREHENSIVE METABOLIC PANEL  ?URINALYSIS, ROUTINE W REFLEX MICROSCOPIC  ?TSH  ?T4, FREE  ?TROPONIN I (HIGH SENSITIVITY)  ? ? ? ?EKG ? ?My interpretation of EKG: ? ?Ventricular paced rhythm with a rate of 70 without any ST elevation, T wave inversions in 3 and aVF ? ?RADIOLOGY ?None  ? ?PROCEDURES: ? ?Critical Care performed: No ? ?.1-3 Lead EKG Interpretation ?Performed by: Concha Se, MD ?Authorized  by: Concha Se, MD  ? ?  ECG rate:  60 ?  ECG rate assessment: normal   ?  Rhythm: paced   ?  Ectopy: none   ?  Conduction: normal   ? ? ?MEDICATIONS ORDERED IN ED: ?Medications - No data to display ? ? ?IMPRESSION / MDM / ASSESSMENT AND PLAN / ED COURSE  ?I reviewed the triage vital signs and the nursing notes. ? ? ?Patient comes in with hallucinations, altered mental status that is progressively getting worse in the setting of medication changes.  On neurological exam he appears to be intact although does appear to be confused.  Suspect that this most likely related to medications but will recheck labs, thyroid, urine and check for any evidence of urinary retention.  Patient already had a CT head yesterday that was negative without any intracranial hemorrhage and family deny any falls since yesterday and hitting the head. ? ?4:06 PM discussed with Dr. Selina Cooley from neurology who recommends patient does get an MRI since no prior MRIs are in the  system. ? ?UA was collected and postvoid was only 100 cc therefore no evidence of retention ? ? ?UA no UTI.  CBC shows slightly low hemoglobin.  BMP shows stable creatinine.  Troponin slightly elevated ? ?Family declined transfer to [facility].  They understand the risk of missing stroke, mass or other potentially cause for confusion but they said this all started the setting of the medications.  They are requesting admission here and deferring MRI for now.  We will discuss the hospital team here ? ? ? ? ?The patient is on the cardiac monitor to evaluate for evidence of arrhythmia and/or significant heart rate changes. ? ?  ? ? ?FINAL CLINICAL IMPRESSION(S) / ED DIAGNOSES  ? ?Final diagnoses:  ?Hallucinations  ?Confusion  ? ? ? ?Rx / DC Orders  ? ?ED Discharge Orders   ? ? None  ? ?  ? ? ? ?Note:  This document was prepared using Dragon voice recognition software and may include unintentional dictation errors. ?  ?Concha Se, MD ?12/17/21 1656 ? ?

## 2021-12-17 NOTE — ED Triage Notes (Signed)
Pt with pmh of pacemaker and dementia arrives today with [facility] with concerns for hallucinations starting yesterday. Pt was taken to [facility] yesterday and had full workup with negative results. Pt arrives today with hallucinations incluing crawling around floor and seeing things that no one else sees. Pt has been on Aricept for years but was recently started on Seroquel and Trazodone. Pt arrives oriented to self only. Pt resting in stretcher not vocalizing any concerns at this time ?
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
## 2021-12-18 ENCOUNTER — Other Ambulatory Visit: Payer: Self-pay

## 2021-12-18 ENCOUNTER — Encounter: Payer: Self-pay | Admitting: Internal Medicine

## 2021-12-18 DIAGNOSIS — Z7189 Other specified counseling: Secondary | ICD-10-CM

## 2021-12-18 DIAGNOSIS — R441 Visual hallucinations: Secondary | ICD-10-CM | POA: Diagnosis not present

## 2021-12-18 LAB — BASIC METABOLIC PANEL
Anion gap: 7 (ref 5–15)
BUN: 18 mg/dL (ref 8–23)
CO2: 26 mmol/L (ref 22–32)
Calcium: 8.6 mg/dL — ABNORMAL LOW (ref 8.9–10.3)
Chloride: 108 mmol/L (ref 98–111)
Creatinine, Ser: 0.87 mg/dL (ref 0.61–1.24)
GFR, Estimated: >60 mL/min (ref >60)
Glucose, Bld: 92 mg/dL (ref 70–99)
Potassium: 3.1 mmol/L — ABNORMAL LOW (ref 3.5–5.1)
Sodium: 141 mmol/L (ref 135–145)

## 2021-12-18 LAB — CBC
HCT: 33.2 % — ABNORMAL LOW (ref 39.0–52.0)
Hemoglobin: 11 g/dL — ABNORMAL LOW (ref 13.0–17.0)
MCH: 31.7 pg (ref 26.0–34.0)
MCHC: 33.1 g/dL (ref 30.0–36.0)
MCV: 95.7 fL (ref 80.0–100.0)
Platelets: 186 10*3/uL (ref 150–400)
RBC: 3.47 MIL/uL — ABNORMAL LOW (ref 4.22–5.81)
RDW: 14.7 % (ref 11.5–15.5)
WBC: 5.3 10*3/uL (ref 4.0–10.5)
nRBC: 0 % (ref 0.0–0.2)

## 2021-12-18 MED ORDER — POTASSIUM CHLORIDE CRYS ER 20 MEQ PO TBCR
40.0000 meq | EXTENDED_RELEASE_TABLET | Freq: Once | ORAL | Status: AC
Start: 2021-12-18 — End: 2021-12-18
  Administered 2021-12-18: 40 meq via ORAL
  Filled 2021-12-18: qty 2

## 2021-12-18 NOTE — Care Management CC44 (Signed)
Condition Code 44 Documentation Completed ? ?Patient Details  ?Name: Keith Estes ?MRN: [id number] ?Date of Birth: 1929/07/20 ? ? ?Condition Code 44 given: Yes   ?Patient signature on Condition Code 44 notice:   Yes ?Documentation of 2 MD's agreement:  Yes  ?Code 44 added to claim:  yes  ? ? ? ?Caryn Section, RN ?12/18/2021, 5:02 PM ? ?

## 2021-12-18 NOTE — Discharge Summary (Signed)
Keith Estes NFA:[id number] DOB: 11-Mar-1929 DOA: 12/17/2021  PCP: Myrene Buddy, NP  Admit date: 12/17/2021 Discharge date: 12/19/2021  Admitted From: home Disposition:  home  Recommendations for Outpatient Follow-up:  Follow up with PCP in 1 week Please obtain BMP/CBC in one week Please follow up with neurology Dr. Sherryll Burger in 1-2 weeks  Home Health:yes    Discharge Condition:Stable CODE STATUS:full  Diet recommendation: Heart Healthy  Brief/Interim Summary: Per HPI:  [name] is an 88 y.o. male with dementia, PAF s/p PPM, HTN is brought in by daughter with whom he lives for new onset visual hallucinations and increasing confusion.  Patient's daughter states that patient has had increasing agitation over the past couple of months where as previously he has been cooperative and able to feed himself and toilet himself.  Over the past couple of months she has noted that he is increasingly agitated and has had multiple episodes of wakefulness at nighttime.  He was seen by his PCP last week at which time his Aricept and trazodone were discontinued and patient was started on Seroquel.  Since that time patient has had even worsened agitation, has not been able to sleep at all, has been seeing snakes and having other visual hallucinations and has also been picking at the air and at his body.  He has not had any fevers or chills.  He has not had any falls or head trauma.  He has been eating, drinking and toileting relatively normally.  Of note patient had been seen yesterday at [facility] ER where he was treated with hydration and discharged home.   In the ED patient was noted to be afebrile with normal vital signs and normal laboratory data including normal UA.  Head CT done yesterday was negative.  Patient was discussed with Dr. Selina Cooley from neurology who recommended MRI although patient's family declined to have an MRI because they agree with ED physician and myself that this is most likely  medication related.  The seroquel was discontinued, and restarted on his Aricept and trazodone.  Overnight he slept fine.  Has had no issues.  Geripsychiatry was consulted with No changes in treatment. He will need to follow up with neurology as outpatient.   Agitations and visual hallucinations in patient with dementia Most recent agitation and hallucinations over the past week are related to discontinuation of long-term trazodone and Aricept and initiation of Seroquel. However increased nighttime awakening and agitation is likely progression of his baseline dementia. Acute worsening likely due to seroquel. Resumed trazodone and Aricept Neuropsychiatry consulted, no change in treatment F/u with neurology     PAF Continue carvedilol and Eliquis per home doses Patient ambulates with walker at home PT rec. No PT follow up   CAD/HTN Continue home meds  GERD Continue PPI   Discharge Diagnoses:  Principal Problem:   Hallucination, visual    Discharge Instructions  Discharge Instructions     Diet - low sodium heart healthy   Complete by: As directed    Discharge instructions   Complete by: As directed    Follow up with Dr. Sherryll Burger neurology at [facility] Stop seroquel   Increase activity slowly   Complete by: As directed    No wound care   Complete by: As directed       Allergies as of 12/18/2021       Reactions   Tramadol Nausea And Vomiting   Diltiazem Other (See Comments)   Headache, GI upset   Hydrocodone Nausea  And Vomiting   Lipitor [atorvastatin] Other (See Comments)   Myalgias   Lisinopril Cough   Percodan [oxycodone-aspirin] Nausea And Vomiting   Vicodin [hydrocodone-acetaminophen] Nausea And Vomiting        Medication List     STOP taking these medications    amiodarone 200 MG tablet Commonly known as: PACERONE   dabigatran 150 MG Caps capsule Commonly known as: PRADAXA   ranitidine 300 MG tablet Commonly known as: ZANTAC       TAKE these  medications    albuterol (2.5 MG/3ML) 0.083% nebulizer solution Commonly known as: PROVENTIL Take 2.5 mg by nebulization every 6 (six) hours as needed for wheezing or shortness of breath.   apixaban 5 MG Tabs tablet Commonly known as: ELIQUIS Take by mouth.   B-complex with vitamin C tablet Take 1 tablet by mouth daily.   BIOFLEX PO Take 1 tablet by mouth 2 (two) times daily.   Breo Ellipta 200-25 MCG/ACT Aepb Generic drug: fluticasone furoate-vilanterol Inhale 1 puff into the lungs daily. Rinse mouth after use.   carvedilol 3.125 MG tablet Commonly known as: COREG Take 3.125 mg by mouth 2 (two) times daily with a meal.   donepezil 10 MG tablet Commonly known as: ARICEPT Take 10 mg by mouth at bedtime.   famotidine 40 MG tablet Commonly known as: PEPCID Take 40 mg by mouth at bedtime.   fexofenadine 180 MG tablet Commonly known as: ALLEGRA Take 180 mg by mouth daily.   fluticasone 50 MCG/ACT nasal spray Commonly known as: FLONASE Place 1 spray into both nostrils 2 (two) times daily as needed for allergies or rhinitis.   isosorbide mononitrate 30 MG 24 hr tablet Commonly known as: IMDUR Take 30 mg by mouth daily.   nitroGLYCERIN 0.4 MG SL tablet Commonly known as: NITROSTAT Place 1 tablet (0.4 mg total) under the tongue every 5 (five) minutes as needed for chest pain.   omeprazole 40 MG capsule Commonly known as: PRILOSEC Take 40 mg by mouth daily with supper.   Potassium Chloride CR 8 MEQ Cpcr capsule CR Commonly known as: MICRO-K TAKE 1 CAPSULE(8 MEQ) BY MOUTH EVERY DAY   PRESERVISION AREDS 2 PO Take 1 tablet by mouth 2 (two) times daily.   tamsulosin 0.4 MG Caps capsule Commonly known as: FLOMAX Take 0.4 mg by mouth daily after supper.   torsemide 10 MG tablet Commonly known as: DEMADEX TAKE 1 TABLET(10 MG) BY MOUTH EVERY DAY   traZODone 50 MG tablet Commonly known as: DESYREL Take 100 mg by mouth at bedtime.   UNABLE TO FIND Tumeric 1 tablet  daily   URINOZINC PO Take 1 tablet by mouth 2 (two) times daily.   Vitamin D-3 25 MCG (1000 UT) Caps Take 1 capsule by mouth daily.        Follow-up Information     Lonell Face, MD. Call in 1 week(s).   Specialty: Neurology Contact information: [address] [facility] [place] [address] [phone]                Allergies  Allergen Reactions   Tramadol Nausea And Vomiting   Diltiazem Other (See Comments)    Headache, GI upset   Hydrocodone Nausea And Vomiting   Lipitor [Atorvastatin] Other (See Comments)    Myalgias   Lisinopril Cough   Percodan [Oxycodone-Aspirin] Nausea And Vomiting   Vicodin [Hydrocodone-Acetaminophen] Nausea And Vomiting    Consultations: geripsychiatry   Procedures/Studies: No results found.    Subjective:  No complaints  Discharge Exam: Vitals:   12/18/21 1124 12/18/21 1524  BP: 117/72 138/90  Pulse: 70 72  Resp: 16 16  Temp: 97.6 F (36.4 C) 98 F (36.7 C)  SpO2: 97% 100%   Vitals:   12/18/21 0409 12/18/21 0717 12/18/21 1124 12/18/21 1524  BP: 130/81 122/80 117/72 138/90  Pulse: 72 70 70 72  Resp: 16 16 16 16   Temp: (!) 97.4 F (36.3 C) (!) 97.5 F (36.4 C) 97.6 F (36.4 C) 98 F (36.7 C)  TempSrc: Oral Axillary Oral   SpO2: 98% 98% 97% 100%  Weight:        General: Pt is alert, awake, not in acute distress Cardiovascular: RRR, S1/S2 +, no rubs, no gallops Respiratory: CTA bilaterally, no wheezing, no rhonchi Abdominal: Soft, NT, ND, bowel sounds + Extremities: no edema, no cyanosis    The results of significant diagnostics from this hospitalization (including imaging, microbiology, ancillary and laboratory) are listed below for reference.     Microbiology: Recent Results (from the past 240 hour(s))  Resp Panel by RT-PCR (Flu A&B, Covid)     Status: None   Collection Time: 12/17/21  4:17 PM   Specimen: Nasopharyngeal(NP) swabs in vial transport medium  Result  Value Ref Range Status   SARS Coronavirus 2 by RT PCR NEGATIVE NEGATIVE Final    Comment: (NOTE) SARS-CoV-2 target nucleic acids are NOT DETECTED.  The SARS-CoV-2 RNA is generally detectable in upper respiratory specimens during the acute phase of infection. The lowest concentration of SARS-CoV-2 viral copies this assay can detect is 138 copies/mL. A negative result does not preclude SARS-Cov-2 infection and should not be used as the sole basis for treatment or other patient management decisions. A negative result may occur with  improper specimen collection/handling, submission of specimen other than nasopharyngeal swab, presence of viral mutation(s) within the areas targeted by this assay, and inadequate number of viral copies(<138 copies/mL). A negative result must be combined with clinical observations, patient history, and epidemiological information. The expected result is Negative.  Fact Sheet for Patients:  BloggerCourse.com  Fact Sheet for Healthcare Providers:  SeriousBroker.it  This test is no t yet approved or cleared by the [place] FDA and  has been authorized for detection and/or diagnosis of SARS-CoV-2 by FDA under an Emergency Use Authorization (EUA). This EUA will remain  in effect (meaning this test can be used) for the duration of the COVID-19 declaration under Section 564(b)(1) of the Act, 21 U.S.C.section 360bbb-3(b)(1), unless the authorization is terminated  or revoked sooner.       Influenza A by PCR NEGATIVE NEGATIVE Final   Influenza B by PCR NEGATIVE NEGATIVE Final    Comment: (NOTE) The Xpert Xpress SARS-CoV-2/FLU/RSV plus assay is intended as an aid in the diagnosis of influenza from Nasopharyngeal swab specimens and should not be used as a sole basis for treatment. Nasal washings and aspirates are unacceptable for Xpert Xpress SARS-CoV-2/FLU/RSV testing.  Fact Sheet for  Patients: BloggerCourse.com  Fact Sheet for Healthcare Providers: SeriousBroker.it  This test is not yet approved or cleared by the [place] FDA and has been authorized for detection and/or diagnosis of SARS-CoV-2 by FDA under an Emergency Use Authorization (EUA). This EUA will remain in effect (meaning this test can be used) for the duration of the COVID-19 declaration under Section 564(b)(1) of the Act, 21 U.S.C. section 360bbb-3(b)(1), unless the authorization is terminated or revoked.  Performed at [facility], [address], [place], [place]  [address]      Labs: BNP (last 3 results) No results for input(s): BNP in the last 8760 hours. Basic Metabolic Panel: Recent Labs  Lab 12/17/21 1444 12/18/21 0537  NA 142 141  K 3.5 3.1*  CL 106 108  CO2 28 26  GLUCOSE 125* 92  BUN 22 18  CREATININE 1.11 0.87  CALCIUM 9.3 8.6*   Liver Function Tests: Recent Labs  Lab 12/17/21 1444  AST 28  ALT 17  ALKPHOS 79  BILITOT 0.9  PROT 7.4  ALBUMIN 3.9   No results for input(s): LIPASE, AMYLASE in the last 168 hours. No results for input(s): AMMONIA in the last 168 hours. CBC: Recent Labs  Lab 12/17/21 1444 12/18/21 0537  WBC 5.4 5.3  NEUTROABS 3.0  --   HGB 11.5* 11.0*  HCT 36.6* 33.2*  MCV 96.8 95.7  PLT 209 186   Cardiac Enzymes: No results for input(s): CKTOTAL, CKMB, CKMBINDEX, TROPONINI in the last 168 hours. BNP: Invalid input(s): POCBNP CBG: No results for input(s): GLUCAP in the last 168 hours. D-Dimer No results for input(s): DDIMER in the last 72 hours. Hgb A1c No results for input(s): HGBA1C in the last 72 hours. Lipid Profile No results for input(s): CHOL, HDL, LDLCALC, TRIG, CHOLHDL, LDLDIRECT in the last 72 hours. Thyroid function studies Recent Labs    12/17/21 1444  TSH 1.997   Anemia work up No results for input(s): VITAMINB12, FOLATE, FERRITIN, TIBC, IRON, RETICCTPCT in  the last 72 hours. Urinalysis    Component Value Date/Time   COLORURINE YELLOW (A) 12/17/2021 1616   APPEARANCEUR CLEAR (A) 12/17/2021 1616   LABSPEC 1.015 12/17/2021 1616   PHURINE 5.0 12/17/2021 1616   GLUCOSEU NEGATIVE 12/17/2021 1616   HGBUR NEGATIVE 12/17/2021 1616   BILIRUBINUR NEGATIVE 12/17/2021 1616   KETONESUR NEGATIVE 12/17/2021 1616   PROTEINUR NEGATIVE 12/17/2021 1616   NITRITE NEGATIVE 12/17/2021 1616   LEUKOCYTESUR NEGATIVE 12/17/2021 1616   Sepsis Labs Invalid input(s): PROCALCITONIN,  WBC,  LACTICIDVEN Microbiology Recent Results (from the past 240 hour(s))  Resp Panel by RT-PCR (Flu A&B, Covid)     Status: None   Collection Time: 12/17/21  4:17 PM   Specimen: Nasopharyngeal(NP) swabs in vial transport medium  Result Value Ref Range Status   SARS Coronavirus 2 by RT PCR NEGATIVE NEGATIVE Final    Comment: (NOTE) SARS-CoV-2 target nucleic acids are NOT DETECTED.  The SARS-CoV-2 RNA is generally detectable in upper respiratory specimens during the acute phase of infection. The lowest concentration of SARS-CoV-2 viral copies this assay can detect is 138 copies/mL. A negative result does not preclude SARS-Cov-2 infection and should not be used as the sole basis for treatment or other patient management decisions. A negative result may occur with  improper specimen collection/handling, submission of specimen other than nasopharyngeal swab, presence of viral mutation(s) within the areas targeted by this assay, and inadequate number of viral copies(<138 copies/mL). A negative result must be combined with clinical observations, patient history, and epidemiological information. The expected result is Negative.  Fact Sheet for Patients:  BloggerCourse.com  Fact Sheet for Healthcare Providers:  SeriousBroker.it  This test is no t yet approved or cleared by the [place] FDA and  has been authorized for  detection and/or diagnosis of SARS-CoV-2 by FDA under an Emergency Use Authorization (EUA). This EUA will remain  in effect (meaning this test can be used) for the duration of the COVID-19 declaration under Section 564(b)(1) of the Act, 21 U.S.C.section 360bbb-3(b)(1), unless  the authorization is terminated  or revoked sooner.       Influenza A by PCR NEGATIVE NEGATIVE Final   Influenza B by PCR NEGATIVE NEGATIVE Final    Comment: (NOTE) The Xpert Xpress SARS-CoV-2/FLU/RSV plus assay is intended as an aid in the diagnosis of influenza from Nasopharyngeal swab specimens and should not be used as a sole basis for treatment. Nasal washings and aspirates are unacceptable for Xpert Xpress SARS-CoV-2/FLU/RSV testing.  Fact Sheet for Patients: BloggerCourse.com  Fact Sheet for Healthcare Providers: SeriousBroker.it  This test is not yet approved or cleared by the [place] FDA and has been authorized for detection and/or diagnosis of SARS-CoV-2 by FDA under an Emergency Use Authorization (EUA). This EUA will remain in effect (meaning this test can be used) for the duration of the COVID-19 declaration under Section 564(b)(1) of the Act, 21 U.S.C. section 360bbb-3(b)(1), unless the authorization is terminated or revoked.  Performed at [facility], [address] [place], [place] [address]      Time coordinating discharge: Over 30 minutes  SIGNED:   Lynn Ito, MD  [facility] 12/19/2021, 4:06 PM Pager   If 7PM-7AM, please contact night-coverage [email or web address] Password [email or web address]

## 2021-12-18 NOTE — Progress Notes (Signed)
Pt being discharged home with home health, discharge instructions reviewed with daughter Malva Cogan, states understanding, advised to follow up as order with neurology, pt with no complaint ?

## 2021-12-18 NOTE — Discharge Summary (Incomplete)
Keith Estes:[id number] DOB: 1929-03-20 DOA: 12/17/2021 ? ?PCP: Myrene Buddy, NP ? ?Admit date: 12/17/2021 ?Discharge date: 12/18/2021 ? ?Admitted From: *** ?Disposition:  *** ? ?Recommendations for Outpatient Follow-up:  ?Follow up with PCP in 1 week ?Please obtain BMP/CBC in one week ?Please follow up on the following pending results: ? ?Home Health:***  ? ? ?Discharge Condition:Stable ?CODE STATUS:***  ?Diet recommendation: Heart Healthy / Carb Modified / Regular / Dysphagia ? ?Brief/Interim Summary: ? ? ?Discharge Diagnoses:  ?Principal Problem: ?  Hallucination, visual ? ? ? ?Discharge Instructions ? ? ?Allergies as of 12/18/2021   ? ?   Reactions  ? Tramadol Nausea And Vomiting  ? Diltiazem Other (See Comments)  ? Headache, GI upset  ? Hydrocodone Nausea And Vomiting  ? Lipitor [atorvastatin] Other (See Comments)  ? Myalgias  ? Lisinopril Cough  ? Percodan [oxycodone-aspirin] Nausea And Vomiting  ? Vicodin [hydrocodone-acetaminophen] Nausea And Vomiting  ? ?  ? ?  ?Medication List  ?  ? ?STOP taking these medications   ? ?amiodarone 200 MG tablet ?Commonly known as: PACERONE ?  ?dabigatran 150 MG Caps capsule ?Commonly known as: PRADAXA ?  ?ranitidine 300 MG tablet ?Commonly known as: ZANTAC ?  ? ?  ? ?TAKE these medications   ? ?albuterol (2.5 MG/3ML) 0.083% nebulizer solution ?Commonly known as: PROVENTIL ?Take 2.5 mg by nebulization every 6 (six) hours as needed for wheezing or shortness of breath. ?  ?apixaban 5 MG Tabs tablet ?Commonly known as: ELIQUIS ?Take by mouth. ?  ?B-complex with vitamin C tablet ?Take 1 tablet by mouth daily. ?  ?BIOFLEX PO ?Take 1 tablet by mouth 2 (two) times daily. ?  ?Breo Ellipta 200-25 MCG/ACT Aepb ?Generic drug: fluticasone furoate-vilanterol ?Inhale 1 puff into the lungs daily. Rinse mouth after use. ?  ?carvedilol 3.125 MG tablet ?Commonly known as: COREG ?Take 3.125 mg by mouth 2 (two) times daily with a meal. ?  ?donepezil 10 MG tablet ?Commonly known as:  ARICEPT ?Take 10 mg by mouth at bedtime. ?  ?famotidine 40 MG tablet ?Commonly known as: PEPCID ?Take 40 mg by mouth at bedtime. ?  ?fexofenadine 180 MG tablet ?Commonly known as: ALLEGRA ?Take 180 mg by mouth daily. ?  ?fluticasone 50 MCG/ACT nasal spray ?Commonly known as: FLONASE ?Place 1 spray into both nostrils 2 (two) times daily as needed for allergies or rhinitis. ?  ?isosorbide mononitrate 30 MG 24 hr tablet ?Commonly known as: IMDUR ?Take 30 mg by mouth daily. ?  ?nitroGLYCERIN 0.4 MG SL tablet ?Commonly known as: NITROSTAT ?Place 1 tablet (0.4 mg total) under the tongue every 5 (five) minutes as needed for chest pain. ?  ?omeprazole 40 MG capsule ?Commonly known as: PRILOSEC ?Take 40 mg by mouth daily with supper. ?  ?Potassium Chloride CR 8 MEQ Cpcr capsule CR ?Commonly known as: MICRO-K ?TAKE 1 CAPSULE(8 MEQ) BY MOUTH EVERY DAY ?  ?PRESERVISION AREDS 2 PO ?Take 1 tablet by mouth 2 (two) times daily. ?  ?tamsulosin 0.4 MG Caps capsule ?Commonly known as: FLOMAX ?Take 0.4 mg by mouth daily after supper. ?  ?torsemide 10 MG tablet ?Commonly known as: DEMADEX ?TAKE 1 TABLET(10 MG) BY MOUTH EVERY DAY ?  ?traZODone 50 MG tablet ?Commonly known as: DESYREL ?Take 100 mg by mouth at bedtime. ?  ?UNABLE TO FIND ?Tumeric 1 tablet daily ?  ?URINOZINC PO ?Take 1 tablet by mouth 2 (two) times daily. ?  ?Vitamin D-3 25 MCG (1000 UT) Caps ?Take 1  capsule by mouth daily. ?  ? ?  ? ? Follow-up Information   ? ? Lonell FaceShah, Hemang K, MD Follow up in 1 week(s).   ?Specialty: Neurology ?Contact information: ?[address] ?[facility] ?[place] [id number] ?[phone] ? ? ?  ?  ? ?  ?  ? ?  ? ?Allergies  ?Allergen Reactions  ? Tramadol Nausea And Vomiting  ? Diltiazem Other (See Comments)  ?  Headache, GI upset  ? Hydrocodone Nausea And Vomiting  ? Lipitor [Atorvastatin] Other (See Comments)  ?  Myalgias  ? Lisinopril Cough  ? Percodan [Oxycodone-Aspirin] Nausea And Vomiting  ? Vicodin  [Hydrocodone-Acetaminophen] Nausea And Vomiting  ? ? ?Consultations: ? ? ? ?Procedures/Studies: ?No results found. ? ? ? ?Subjective: ? ? ?Discharge Exam: ?Vitals:  ? 12/18/21 0717 12/18/21 1124  ?BP: 122/80 117/72  ?Pulse: 70 70  ?Resp: 16 16  ?Temp: (!) 97.5 ?F (36.4 ?C) 97.6 ?F (36.4 ?C)  ?SpO2: 98% 97%  ? ?Vitals:  ? 12/17/21 2221 12/18/21 0409 12/18/21 0717 12/18/21 1124  ?BP: (!) 141/85 130/81 122/80 117/72  ?Pulse: 70 72 70 70  ?Resp: 16 16 16 16   ?Temp: (!) 97.5 ?F (36.4 ?C) (!) 97.4 ?F (36.3 ?C) (!) 97.5 ?F (36.4 ?C) 97.6 ?F (36.4 ?C)  ?TempSrc: Oral Oral Axillary Oral  ?SpO2: 97% 98% 98% 97%  ?Weight:      ? ? ?General: Pt is alert, awake, not in acute distress ?Cardiovascular: RRR, S1/S2 +, no rubs, no gallops ?Respiratory: CTA bilaterally, no wheezing, no rhonchi ?Abdominal: Soft, NT, ND, bowel sounds + ?Extremities: no edema, no cyanosis ? ? ? ?The results of significant diagnostics from this hospitalization (including imaging, microbiology, ancillary and laboratory) are listed below for reference.   ? ? ?Microbiology: ?Recent Results (from the past 240 hour(s))  ?Resp Panel by RT-PCR (Flu A&B, Covid)     Status: None  ? Collection Time: 12/17/21  4:17 PM  ? Specimen: Nasopharyngeal(NP) swabs in vial transport medium  ?Result Value Ref Range Status  ? SARS Coronavirus 2 by RT PCR NEGATIVE NEGATIVE Final  ?  Comment: (NOTE) ?SARS-CoV-2 target nucleic acids are NOT DETECTED. ? ?The SARS-CoV-2 RNA is generally detectable in upper respiratory ?specimens during the acute phase of infection. The lowest ?concentration of SARS-CoV-2 viral copies this assay can detect is ?138 copies/mL. A negative result does not preclude SARS-Cov-2 ?infection and should not be used as the sole basis for treatment or ?other patient management decisions. A negative result may occur with  ?improper specimen collection/handling, submission of specimen other ?than nasopharyngeal swab, presence of viral mutation(s) within the ?areas  targeted by this assay, and inadequate number of viral ?copies(<138 copies/mL). A negative result must be combined with ?clinical observations, patient history, and epidemiological ?information. The expected result is Negative. ? ?Fact Sheet for Patients:  ?BloggerCourse.comhttps://www.fda.gov/media/152166/download ? ?Fact Sheet for Healthcare Providers:  ?SeriousBroker.ithttps://www.fda.gov/media/152162/download ? ?This test is no t yet approved or cleared by the [place] FDA and  ?has been authorized for detection and/or diagnosis of SARS-CoV-2 by ?FDA under an Emergency Use Authorization (EUA). This EUA will remain  ?in effect (meaning this test can be used) for the duration of the ?COVID-19 declaration under Section 564(b)(1) of the Act, 21 ?U.S.C.section 360bbb-3(b)(1), unless the authorization is terminated  ?or revoked sooner.  ? ? ?  ? Influenza A by PCR NEGATIVE NEGATIVE Final  ? Influenza B by PCR NEGATIVE NEGATIVE Final  ?  Comment: (NOTE) ?The Xpert Xpress  SARS-CoV-2/FLU/RSV plus assay is intended as an aid ?in the diagnosis of influenza from Nasopharyngeal swab specimens and ?should not be used as a sole basis for treatment. Nasal washings and ?aspirates are unacceptable for Xpert Xpress SARS-CoV-2/FLU/RSV ?testing. ? ?Fact Sheet for Patients: ?BloggerCourse.com ? ?Fact Sheet for Healthcare Providers: ?SeriousBroker.it ? ?This test is not yet approved or cleared by the [place] FDA and ?has been authorized for detection and/or diagnosis of SARS-CoV-2 by ?FDA under an Emergency Use Authorization (EUA). This EUA will remain ?in effect (meaning this test can be used) for the duration of the ?COVID-19 declaration under Section 564(b)(1) of the Act, 21 U.S.C. ?section 360bbb-3(b)(1), unless the authorization is terminated or ?revoked. ? ?Performed at [facility], [address] [place], ?[place] [address] ?  ?  ? ?Labs: ?BNP (last 3 results) ?No results for input(s): BNP  in the last 8760 hours. ?Basic Metabolic Panel: ?Recent Labs  ?Lab 12/17/21 ?1444 12/18/21 ?7793  ?NA 142 141  ?K 3.5 3.1*  ?CL 106 108  ?CO2 28 26  ?GLUCOSE 125* 92  ?BUN 22 18  ?CREATININE 1.11 0.87  ?CALCIUM 9.3 8.6

## 2021-12-18 NOTE — TOC Progression Note (Signed)
Transition of Care (TOC) - Progression Note  ? ? ?Patient Details  ?Name: Keith Estes ?MRN: [id number] ?Date of Birth: 30-Mar-1929 ? ?Transition of Care (TOC) CM/SW Contact  ?Caryn Section, RN ?Phone Number: ?12/18/2021, 5:12 PM ? ?Clinical Narrative:   Duaghter upset with Code 14, aksed to speak with Nurse and MD.  Message given to nurse code 8 message given to daughter.  Walker [email or web address]  spoke about alzheimer's association resources, daughter will look into it. ? ? ? ?  ?  ? ?Expected Discharge Plan and Services ?  ?  ?  ?  ?  ?Expected Discharge Date: 12/18/21               ?  ?  ?  ?  ?  ?  ?  ?  ?  ?  ? ? ?Social Determinants of Health (SDOH) Interventions ?  ? ?Readmission Risk Interventions ?   ? View : No data to display.  ?  ?  ?  ? ? ?
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
## 2021-12-18 NOTE — Evaluation (Signed)
Physical Therapy Evaluation ?Patient Details ?Name: Keith Estes ?MRN: [id number] ?DOB: 11-Jan-1929 ?Today's Date: 12/18/2021 ? ?History of Present Illness ? TORRI LANGSTON is an 88 y.o. male with dementia, PAF s/p PPM, HTN is brought in by daughter with whom he lives for new onset visual hallucinations and increasing confusion.  Of note patient had been seen 12/16/21 at [facility] ER where he was treated with hydration and discharged home. ?  ?Clinical Impression ? Pt was pleasant and motivated to participate during the session and put forth good effort throughout. Pt was steady with good control and stability with transfers, gait, and stairs.  Pt was able to amb 2 x 125' and ascend/descend 6 stairs with no noted LOB or instability or report of adverse symptoms.  Pt appears to be at his baseline level of function as reported by family with no need of skilled PT services going forward.  Will complete PT orders at this time but will reassess pt pending a change in status upon receipt of new PT orders. ?   ?   ? ?Recommendations for follow up therapy are one component of a multi-disciplinary discharge planning process, led by the attending physician.  Recommendations may be updated based on patient status, additional functional criteria and insurance authorization. ? ?Follow Up Recommendations No PT follow up ? ?  ?Assistance Recommended at Discharge Frequent or constant Supervision/Assistance  ?Patient can return home with the following ? A little help with walking and/or transfers;A little help with bathing/dressing/bathroom;Help with stairs or ramp for entrance;Assist for transportation;Direct supervision/assist for medications management;Assistance with cooking/housework;Direct supervision/assist for financial management ? ?  ?Equipment Recommendations None recommended by PT  ?Recommendations for Other Services ?    ?  ?Functional Status Assessment Patient has not had a recent decline in their functional status  ? ?   ?Precautions / Restrictions Precautions ?Precautions: Fall ?Restrictions ?Weight Bearing Restrictions: No  ? ?  ? ?Mobility ? Bed Mobility ?  ?  ?  ?  ?  ?  ?  ?General bed mobility comments: NT, pt in recliner ?  ? ?Transfers ?Overall transfer level: Needs assistance ?Equipment used: Rolling walker (2 wheels) ?Transfers: Sit to/from Stand ?Sit to Stand: Supervision ?  ?  ?  ?  ?  ?General transfer comment: Good control and stability with sit to/from stands from recliner and BSC ?  ? ?Ambulation/Gait ?Ambulation/Gait assistance: Supervision ?Gait Distance (Feet): 125 Feet x 2 ?Assistive device: Rolling walker (2 wheels) ?Gait Pattern/deviations: Step-through pattern, Decreased step length - right, Decreased step length - left, Trunk flexed ?Gait velocity: decreased ?  ?  ?General Gait Details: Slow cadence but steady without LOB ? ?Stairs ?Stairs: Yes ?Stairs assistance: Supervision ?Stair Management: Two rails, Forwards, Alternating pattern ?Number of Stairs: 6 ?General stair comments: Pt able to ascend and then descend 6 steps with B rails with good eccentric and concentric control and stability ? ?Wheelchair Mobility ?  ? ?Modified Rankin (Stroke Patients Only) ?  ? ?  ? ?Balance Overall balance assessment: Needs assistance ?Sitting-balance support: No upper extremity supported, Feet supported ?Sitting balance-Leahy Scale: Good ?  ?  ?Standing balance support: Bilateral upper extremity supported, During functional activity ?Standing balance-Leahy Scale: Good ?  ?  ?  ?  ?  ?  ?  ?  ?  ?  ?  ?  ?   ? ? ? ?Pertinent Vitals/Pain Pain Assessment ?Pain Assessment: No/denies pain  ? ? ?Home Living Family/patient expects to be discharged to::  Private residence ?Living Arrangements: Children ?Available Help at Discharge: Family;Available 24 hours/day ?Type of Home: House ?Home Access: Stairs to enter ?Entrance Stairs-Rails: Can reach both ?Entrance Stairs-Number of Steps: 13 ?Alternate Level Stairs-Number of Steps: Pt  lives in basement with 13 outdoor steps down to the lower level with bilateral hand rails ?Home Layout: Two level ?Home Equipment: Rollator (4 wheels);Cane - single point ?Additional Comments: Daughter reports full time caregiver for pt and his spouse (she requires more assistance). Pt lives in basement with daughter upstairs (motion cameras used to assist with safety)  ?  ?Prior Function Prior Level of Function : Needs assist ? Cognitive Assist : ADLs (cognitive);Mobility (cognitive) ?  ?  ?  ?  ?  ?Mobility Comments: SBA for walking outside using SPC, uses 4WW inside, no fall history ?ADLs Comments: Assist for dressing/bathing/toileting as needed ?  ? ? ?Hand Dominance  ? Dominant Hand: Right ? ?  ?Extremity/Trunk Assessment  ? Upper Extremity Assessment ?Upper Extremity Assessment: Overall WFL for tasks assessed ?  ? ?Lower Extremity Assessment ?Lower Extremity Assessment: Overall WFL for tasks assessed ?  ? ?   ?Communication  ? Communication: HOH  ?Cognition Arousal/Alertness: Awake/alert ?Behavior During Therapy: [facility] for tasks assessed/performed ?Overall Cognitive Status: Within Functional Limits for tasks assessed ?  ?  ?  ?  ?  ?  ?  ?  ?  ?  ?  ?  ?  ?  ?  ?  ?  ?  ?  ? ?  ?General Comments   ? ?  ?Exercises    ? ?Assessment/Plan  ?  ?PT Assessment Patient does not need any further PT services  ?PT Problem List   ? ?   ?  ?PT Treatment Interventions     ? ?PT Goals (Current goals can be found in the Care Plan section)  ?Acute Rehab PT Goals ?PT Goal Formulation: All assessment and education complete, DC therapy ? ?  ?Frequency   ?  ? ? ?Co-evaluation   ?  ?  ?  ?  ? ? ?  ?AM-PAC PT "6 Clicks" Mobility  ?Outcome Measure Help needed turning from your back to your side while in a flat bed without using bedrails?: None ?Help needed moving from lying on your back to sitting on the side of a flat bed without using bedrails?: None ?Help needed moving to and from a bed to a chair (including a wheelchair)?: A  Little ?Help needed standing up from a chair using your arms (e.g., wheelchair or bedside chair)?: A Little ?Help needed to walk in hospital room?: A Little ?Help needed climbing 3-5 steps with a railing? : A Little ?6 Click Score: 20 ? ?  ?End of Session Equipment Utilized During Treatment: Gait belt ?Activity Tolerance: Patient tolerated treatment well ?Patient left: Other (comment) (Pt left in BR for a BM with sitter present) ?Nurse Communication: Mobility status ?PT Visit Diagnosis: Muscle weakness (generalized) (M62.81) ?  ? ?Time: 2130-8657 ?PT Time Calculation (min) (ACUTE ONLY): 16 min ? ? ?Charges:   PT Evaluation ?$PT Eval Low Complexity: 1 Low ?  ?  ?   ? ? ?D. Elly Modena PT, DPT ?12/18/21, 2:06 PM ? ? ?
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
## 2021-12-18 NOTE — Evaluation (Signed)
Occupational Therapy Evaluation ?Patient Details ?Name: Keith Estes ?MRN: [id number] ?DOB: 12-05-1928 ?Today's Date: 12/18/2021 ? ? ?History of Present Illness Keith Estes is an 88 y.o. male with dementia, PAF s/p PPM, HTN is brought in by daughter with whom he lives for new onset visual hallucinations and increasing confusion.  Of note patient had been seen yesterday (12/16/21) at [facility] ER where he was treated with hydration and discharged home.  ? ?Clinical Impression ?  ?Keith Estes was seen for OT evaluation this date. Prior to hospital admission, pt was MOD I for mobility using 4WW in house and SPC in yard, MIN cues/assist for ADLs. Pt lives with spouse in basement (13 STE with B rails) of daughters home with 24/7 SUPERVISION. Pt presents to acute OT demonstrating impaired ADL performance and functional mobility 2/2 decreased activity tolerance and functional balance deficits. Pt is A&O x3, disoriented to situation only.  ? ?Pt currently requires MIN cues don B socks seated in chair. SBA + RW toilet t/f and pericare in sitting and hand washing standing sink side. MIN A don briefs in standing. MIN cues self -feeding - VCs to continue eating as pt easily distracted. SBA + MIN cues + RW for hallway navigation task - tolerates ~200 ft, cues to locate correct room. Pt would benefit from skilled OT to address noted impairments and functional limitations (see below for any additional details). Upon hospital discharge, recommend HHOT to maximize pt safety and return to PLOF.   ? ?Recommendations for follow up therapy are one component of a multi-disciplinary discharge planning process, led by the attending physician.  Recommendations may be updated based on patient status, additional functional criteria and insurance authorization.  ? ?Follow Up Recommendations ? Home health OT  ?  ?Assistance Recommended at Discharge Intermittent Supervision/Assistance  ?Patient can return home with the following A little help  with walking and/or transfers;A little help with bathing/dressing/bathroom ? ?  ?Functional Status Assessment ? Patient has had a recent decline in their functional status and demonstrates the ability to make significant improvements in function in a reasonable and predictable amount of time.  ?Equipment Recommendations ? Other (comment) (RW)  ?  ?Recommendations for Other Services   ? ? ?  ?Precautions / Restrictions Precautions ?Precautions: Fall ?Restrictions ?Weight Bearing Restrictions: No  ? ?  ? ?Mobility Bed Mobility ?Overal bed mobility: Needs Assistance ?Bed Mobility: Supine to Sit ?  ?  ?Supine to sit: Supervision, HOB elevated ?  ?  ?  ?  ? ?Transfers ?Overall transfer level: Needs assistance ?Equipment used: Rolling walker (2 wheels) ?Transfers: Sit to/from Stand ?Sit to Stand: Min guard ?  ?  ?  ?  ?  ?General transfer comment: from low toilet height and regular chair height ?  ? ?  ?Balance Overall balance assessment: Needs assistance ?Sitting-balance support: No upper extremity supported, Feet supported ?Sitting balance-Leahy Scale: Good ?  ?  ?Standing balance support: No upper extremity supported, During functional activity ?Standing balance-Leahy Scale: Good ?  ?  ?  ?  ?  ?  ?  ?  ?  ?  ?  ?  ?   ? ?ADL either performed or assessed with clinical judgement  ? ?ADL Overall ADL's : Needs assistance/impaired ?  ?  ?  ?  ?  ?  ?  ?  ?  ?  ?  ?  ?  ?  ?  ?  ?  ?  ?  ?General ADL Comments:  MIN cues don B socks seated in chair. SBA + RW toilet t/f and pericare in sitting and hand washing standing sink side. MIN A don briefs in standing. MIN cues self -feeding - VCs to continue eating as pt easily distracted  ? ? ? ? ?Pertinent Vitals/Pain Pain Assessment ?Pain Assessment: No/denies pain  ? ? ? ?Hand Dominance Right ?  ?Extremity/Trunk Assessment Upper Extremity Assessment ?Upper Extremity Assessment: Overall WFL for tasks assessed ?  ?Lower Extremity Assessment ?Lower Extremity Assessment: Overall WFL  for tasks assessed ?  ?  ?  ?Communication Communication ?Communication: HOH ?  ?Cognition Arousal/Alertness: Awake/alert ?Behavior During Therapy: [facility] for tasks assessed/performed ?Overall Cognitive Status: Within Functional Limits for tasks assessed ?  ?  ?  ?  ?  ?  ?  ?  ?  ?  ?  ?  ?  ?  ?  ?  ?  ?  ?  ? ?Home Living Family/patient expects to be discharged to:: Private residence ?Living Arrangements: Children ?Available Help at Discharge: Family;Available 24 hours/day ?Type of Home: House ?Home Access: Stairs to enter ?Entrance Stairs-Number of Steps: 13 ?Entrance Stairs-Rails: Can reach both ?Home Layout: Multi-level ?  ?  ?  ?  ?  ?  ?  ?Home Equipment: Rollator (4 wheels);Cane - single point ?  ?Additional Comments: Daughter reports full time caregiver for pt and his spouse (she requires more assistance). Pt lives in basement with daughter upstairs (motion cameras used to assist with safety) ?  ? ?  ?Prior Functioning/Environment Prior Level of Function : Needs assist ? Cognitive Assist : ADLs (cognitive);Mobility (cognitive) ?  ?  ?  ?  ?  ?Mobility Comments: SUPERVISION for walking outside using [facility], uses 4WW inside ?ADLs Comments: Assist for dressing/bathing/toileting as needed ?  ? ?  ?  ?OT Problem List: Decreased activity tolerance;Impaired balance (sitting and/or standing);Decreased safety awareness ?  ?   ?OT Treatment/Interventions: Self-care/ADL training;Therapeutic exercise;Energy conservation;DME and/or AE instruction;Therapeutic activities;Patient/family education;Balance training  ?  ?OT Goals(Current goals can be found in the care plan section) Acute Rehab OT Goals ?Patient Stated Goal: to go home ?OT Goal Formulation: With family ?Time For Goal Achievement: 01/01/22 ?Potential to Achieve Goals: Good ?ADL Goals ?Pt Will Perform Grooming: standing;Independently ?Pt Will Perform Lower Body Dressing: with modified independence;sit to/from stand ?Pt Will Transfer to Toilet: with modified  independence;ambulating;regular height toilet  ?OT Frequency: Min 2X/week ?  ? ?Co-evaluation   ?  ?  ?  ?  ? ?  ?AM-PAC OT "6 Clicks" Daily Activity     ?Outcome Measure Help from another person eating meals?: None ?Help from another person taking care of personal grooming?: A Little ?Help from another person toileting, which includes using toliet, bedpan, or urinal?: A Little ?Help from another person bathing (including washing, rinsing, drying)?: A Little ?Help from another person to put on and taking off regular upper body clothing?: None ?Help from another person to put on and taking off regular lower body clothing?: A Little ?6 Click Score: 20 ?  ?End of Session Equipment Utilized During Treatment: Rolling walker (2 wheels) ? ?Activity Tolerance: Patient tolerated treatment well ?Patient left: in chair;with call bell/phone within reach;with nursing/sitter in room;with family/visitor present ? ?OT Visit Diagnosis: Other abnormalities of gait and mobility (R26.89)  ?              ?Time: 1751-0258 ?OT Time Calculation (min): 40 min ?Charges:  OT General Charges ?$OT Visit: 1 Visit ?OT Evaluation ?$  OT Eval Moderate Complexity: 1 Mod ?OT Treatments ?$Self Care/Home Management : 23-37 mins ? ?Kathie Dikeevin Gladys Deckard, M.S. OTR/L  ?12/18/21, 11:35 AM  ?ascom [phone] ? ?

## 2022-09-27 ENCOUNTER — Inpatient Hospital Stay
Admission: EM | Admit: 2022-09-27 | Discharge: 2022-10-03 | DRG: 368 | Disposition: A | Discharge status: Hospice | Payer: Medicare Other | Attending: Internal Medicine | Admitting: Internal Medicine

## 2022-09-27 ENCOUNTER — Inpatient Hospital Stay: Payer: Medicare Other

## 2022-09-27 ENCOUNTER — Emergency Department: Payer: Medicare Other

## 2022-09-27 ENCOUNTER — Other Ambulatory Visit: Payer: Self-pay

## 2022-09-27 DIAGNOSIS — K219 Gastro-esophageal reflux disease without esophagitis: Secondary | ICD-10-CM | POA: Diagnosis present

## 2022-09-27 DIAGNOSIS — Z79899 Other long term (current) drug therapy: Secondary | ICD-10-CM

## 2022-09-27 DIAGNOSIS — I2510 Atherosclerotic heart disease of native coronary artery without angina pectoris: Secondary | ICD-10-CM | POA: Diagnosis present

## 2022-09-27 DIAGNOSIS — I482 Chronic atrial fibrillation, unspecified: Secondary | ICD-10-CM | POA: Diagnosis not present

## 2022-09-27 DIAGNOSIS — I252 Old myocardial infarction: Secondary | ICD-10-CM

## 2022-09-27 DIAGNOSIS — D62 Acute posthemorrhagic anemia: Secondary | ICD-10-CM | POA: Diagnosis present

## 2022-09-27 DIAGNOSIS — K921 Melena: Secondary | ICD-10-CM | POA: Diagnosis not present

## 2022-09-27 DIAGNOSIS — F02818 Dementia in other diseases classified elsewhere, unspecified severity, with other behavioral disturbance: Secondary | ICD-10-CM | POA: Diagnosis not present

## 2022-09-27 DIAGNOSIS — Z8711 Personal history of peptic ulcer disease: Secondary | ICD-10-CM

## 2022-09-27 DIAGNOSIS — I480 Paroxysmal atrial fibrillation: Secondary | ICD-10-CM | POA: Diagnosis present

## 2022-09-27 DIAGNOSIS — Z66 Do not resuscitate: Secondary | ICD-10-CM | POA: Diagnosis present

## 2022-09-27 DIAGNOSIS — I701 Atherosclerosis of renal artery: Secondary | ICD-10-CM | POA: Insufficient documentation

## 2022-09-27 DIAGNOSIS — G9341 Metabolic encephalopathy: Secondary | ICD-10-CM | POA: Diagnosis present

## 2022-09-27 DIAGNOSIS — Z885 Allergy status to narcotic agent status: Secondary | ICD-10-CM

## 2022-09-27 DIAGNOSIS — G4733 Obstructive sleep apnea (adult) (pediatric): Secondary | ICD-10-CM | POA: Diagnosis present

## 2022-09-27 DIAGNOSIS — N400 Enlarged prostate without lower urinary tract symptoms: Secondary | ICD-10-CM | POA: Insufficient documentation

## 2022-09-27 DIAGNOSIS — Z8249 Family history of ischemic heart disease and other diseases of the circulatory system: Secondary | ICD-10-CM | POA: Diagnosis not present

## 2022-09-27 DIAGNOSIS — R7989 Other specified abnormal findings of blood chemistry: Secondary | ICD-10-CM | POA: Insufficient documentation

## 2022-09-27 DIAGNOSIS — Z1152 Encounter for screening for COVID-19: Secondary | ICD-10-CM

## 2022-09-27 DIAGNOSIS — I10 Essential (primary) hypertension: Secondary | ICD-10-CM | POA: Insufficient documentation

## 2022-09-27 DIAGNOSIS — F05 Delirium due to known physiological condition: Secondary | ICD-10-CM | POA: Diagnosis not present

## 2022-09-27 DIAGNOSIS — K922 Gastrointestinal hemorrhage, unspecified: Secondary | ICD-10-CM | POA: Diagnosis present

## 2022-09-27 DIAGNOSIS — E869 Volume depletion, unspecified: Secondary | ICD-10-CM | POA: Diagnosis present

## 2022-09-27 DIAGNOSIS — L8990 Pressure ulcer of unspecified site, unspecified stage: Secondary | ICD-10-CM | POA: Insufficient documentation

## 2022-09-27 DIAGNOSIS — N1330 Unspecified hydronephrosis: Secondary | ICD-10-CM | POA: Diagnosis present

## 2022-09-27 DIAGNOSIS — Z7901 Long term (current) use of anticoagulants: Secondary | ICD-10-CM

## 2022-09-27 DIAGNOSIS — F03918 Unspecified dementia, unspecified severity, with other behavioral disturbance: Secondary | ICD-10-CM | POA: Diagnosis not present

## 2022-09-27 DIAGNOSIS — G4700 Insomnia, unspecified: Secondary | ICD-10-CM | POA: Diagnosis present

## 2022-09-27 DIAGNOSIS — G309 Alzheimer's disease, unspecified: Secondary | ICD-10-CM | POA: Diagnosis present

## 2022-09-27 DIAGNOSIS — N179 Acute kidney failure, unspecified: Secondary | ICD-10-CM

## 2022-09-27 DIAGNOSIS — E538 Deficiency of other specified B group vitamins: Secondary | ICD-10-CM | POA: Diagnosis present

## 2022-09-27 DIAGNOSIS — F419 Anxiety disorder, unspecified: Secondary | ICD-10-CM | POA: Diagnosis present

## 2022-09-27 DIAGNOSIS — J449 Chronic obstructive pulmonary disease, unspecified: Secondary | ICD-10-CM | POA: Diagnosis present

## 2022-09-27 DIAGNOSIS — N134 Hydroureter: Secondary | ICD-10-CM | POA: Diagnosis not present

## 2022-09-27 DIAGNOSIS — K226 Gastro-esophageal laceration-hemorrhage syndrome: Principal | ICD-10-CM | POA: Diagnosis present

## 2022-09-27 DIAGNOSIS — K920 Hematemesis: Principal | ICD-10-CM

## 2022-09-27 DIAGNOSIS — Z888 Allergy status to other drugs, medicaments and biological substances status: Secondary | ICD-10-CM

## 2022-09-27 DIAGNOSIS — R197 Diarrhea, unspecified: Secondary | ICD-10-CM | POA: Diagnosis not present

## 2022-09-27 DIAGNOSIS — R54 Age-related physical debility: Secondary | ICD-10-CM | POA: Diagnosis present

## 2022-09-27 LAB — CBC WITH DIFFERENTIAL/PLATELET
Abs Immature Granulocytes: 0.04 10*3/uL (ref 0.00–0.07)
Basophils Absolute: 0 10*3/uL (ref 0.0–0.1)
Basophils Relative: 0 %
Eosinophils Absolute: 0.1 10*3/uL (ref 0.0–0.5)
Eosinophils Relative: 1 %
HCT: 31.2 % — ABNORMAL LOW (ref 39.0–52.0)
Hemoglobin: 9.9 g/dL — ABNORMAL LOW (ref 13.0–17.0)
Immature Granulocytes: 1 %
Lymphocytes Relative: 14 %
Lymphs Abs: 1 10*3/uL (ref 0.7–4.0)
MCH: 29.5 pg (ref 26.0–34.0)
MCHC: 31.7 g/dL (ref 30.0–36.0)
MCV: 92.9 fL (ref 80.0–100.0)
Monocytes Absolute: 0.8 10*3/uL (ref 0.1–1.0)
Monocytes Relative: 11 %
Neutro Abs: 5.1 10*3/uL (ref 1.7–7.7)
Neutrophils Relative %: 73 %
Platelets: 195 10*3/uL (ref 150–400)
RBC: 3.36 MIL/uL — ABNORMAL LOW (ref 4.22–5.81)
RDW: 17.3 % — ABNORMAL HIGH (ref 11.5–15.5)
WBC: 7 10*3/uL (ref 4.0–10.5)
nRBC: 0 % (ref 0.0–0.2)

## 2022-09-27 LAB — BASIC METABOLIC PANEL
Anion gap: 8 (ref 5–15)
BUN: 34 mg/dL — ABNORMAL HIGH (ref 8–23)
CO2: 21 mmol/L — ABNORMAL LOW (ref 22–32)
Calcium: 8.1 mg/dL — ABNORMAL LOW (ref 8.9–10.3)
Chloride: 111 mmol/L (ref 98–111)
Creatinine, Ser: 1.62 mg/dL — ABNORMAL HIGH (ref 0.61–1.24)
GFR, Estimated: 40 mL/min — ABNORMAL LOW (ref >60)
Glucose, Bld: 100 mg/dL — ABNORMAL HIGH (ref 70–99)
Potassium: 3.8 mmol/L (ref 3.5–5.1)
Sodium: 140 mmol/L (ref 135–145)

## 2022-09-27 LAB — COMPREHENSIVE METABOLIC PANEL
ALT: 20 U/L (ref 0–44)
AST: 29 U/L (ref 15–41)
Albumin: 3.5 g/dL (ref 3.5–5.0)
Alkaline Phosphatase: 81 U/L (ref 38–126)
Anion gap: 12 (ref 5–15)
BUN: 33 mg/dL — ABNORMAL HIGH (ref 8–23)
CO2: 22 mmol/L (ref 22–32)
Calcium: 8.5 mg/dL — ABNORMAL LOW (ref 8.9–10.3)
Chloride: 108 mmol/L (ref 98–111)
Creatinine, Ser: 1.75 mg/dL — ABNORMAL HIGH (ref 0.61–1.24)
GFR, Estimated: 36 mL/min — ABNORMAL LOW (ref >60)
Glucose, Bld: 114 mg/dL — ABNORMAL HIGH (ref 70–99)
Potassium: 3.8 mmol/L (ref 3.5–5.1)
Sodium: 142 mmol/L (ref 135–145)
Total Bilirubin: 1.3 mg/dL — ABNORMAL HIGH (ref 0.3–1.2)
Total Protein: 7.1 g/dL (ref 6.5–8.1)

## 2022-09-27 LAB — CBC
HCT: 28 % — ABNORMAL LOW (ref 39.0–52.0)
HCT: 24.3 % — ABNORMAL LOW (ref 39.0–52.0)
Hemoglobin: 8.7 g/dL — ABNORMAL LOW (ref 13.0–17.0)
Hemoglobin: 7.7 g/dL — ABNORMAL LOW (ref 13.0–17.0)
MCH: 28.8 pg (ref 26.0–34.0)
MCH: 29.4 pg (ref 26.0–34.0)
MCHC: 31.1 g/dL (ref 30.0–36.0)
MCHC: 31.7 g/dL (ref 30.0–36.0)
MCV: 92.7 fL (ref 80.0–100.0)
MCV: 92.7 fL (ref 80.0–100.0)
Platelets: 178 10*3/uL (ref 150–400)
Platelets: 166 10*3/uL (ref 150–400)
RBC: 3.02 MIL/uL — ABNORMAL LOW (ref 4.22–5.81)
RBC: 2.62 MIL/uL — ABNORMAL LOW (ref 4.22–5.81)
RDW: 17.4 % — ABNORMAL HIGH (ref 11.5–15.5)
RDW: 17.7 % — ABNORMAL HIGH (ref 11.5–15.5)
WBC: 6 10*3/uL (ref 4.0–10.5)
WBC: 4.8 10*3/uL (ref 4.0–10.5)
nRBC: 0 % (ref 0.0–0.2)
nRBC: 0 % (ref 0.0–0.2)

## 2022-09-27 LAB — PROTIME-INR
INR: 1.8 — ABNORMAL HIGH (ref 0.8–1.2)
Prothrombin Time: 20.5 seconds — ABNORMAL HIGH (ref 11.4–15.2)

## 2022-09-27 LAB — GLUCOSE, CAPILLARY: Glucose-Capillary: 105 mg/dL — ABNORMAL HIGH (ref 70–99)

## 2022-09-27 LAB — RESP PANEL BY RT-PCR (RSV, FLU A&B, COVID)  RVPGX2
Influenza A by PCR: NEGATIVE
Influenza B by PCR: NEGATIVE
Resp Syncytial Virus by PCR: NEGATIVE
SARS Coronavirus 2 by RT PCR: NEGATIVE

## 2022-09-27 LAB — PREPARE RBC (CROSSMATCH)

## 2022-09-27 LAB — LIPASE, BLOOD: Lipase: 37 U/L (ref 11–51)

## 2022-09-27 LAB — RETICULOCYTES
Immature Retic Fract: 20.9 % — ABNORMAL HIGH (ref 2.3–15.9)
RBC.: 2.6 MIL/uL — ABNORMAL LOW (ref 4.22–5.81)
Retic Count, Absolute: 46.8 10*3/uL (ref 19.0–186.0)
Retic Ct Pct: 1.8 % (ref 0.4–3.1)

## 2022-09-27 LAB — IRON AND TIBC
Iron: 37 ug/dL — ABNORMAL LOW (ref 45–182)
Saturation Ratios: 12 % — ABNORMAL LOW (ref 17.9–39.5)
TIBC: 304 ug/dL (ref 250–450)
UIBC: 267 ug/dL

## 2022-09-27 LAB — HEMOGLOBIN AND HEMATOCRIT, BLOOD
HCT: 24.5 % — ABNORMAL LOW (ref 39.0–52.0)
Hemoglobin: 7.7 g/dL — ABNORMAL LOW (ref 13.0–17.0)

## 2022-09-27 LAB — TROPONIN I (HIGH SENSITIVITY)
Troponin I (High Sensitivity): 63 ng/L — ABNORMAL HIGH (ref <18)
Troponin I (High Sensitivity): 67 ng/L — ABNORMAL HIGH (ref <18)

## 2022-09-27 LAB — VITAMIN B12: Vitamin B-12: 132 pg/mL — ABNORMAL LOW (ref 180–914)

## 2022-09-27 LAB — FOLATE: Folate: 13.7 ng/mL (ref >5.9)

## 2022-09-27 LAB — FERRITIN: Ferritin: 16 ng/mL — ABNORMAL LOW (ref 24–336)

## 2022-09-27 LAB — LACTIC ACID, PLASMA: Lactic Acid, Venous: 1.6 mmol/L (ref 0.5–1.9)

## 2022-09-27 LAB — MRSA NEXT GEN BY PCR, NASAL: MRSA by PCR Next Gen: NOT DETECTED

## 2022-09-27 LAB — ABO/RH: ABO/RH(D): O POS

## 2022-09-27 MED ORDER — TRAZODONE HCL 50 MG PO TABS
50.0000 mg | ORAL_TABLET | Freq: Every day | ORAL | Status: DC
  Administered 2022-09-27 – 2022-10-02 (×6): 50 mg via ORAL
  Filled 2022-09-27 (×6): qty 1

## 2022-09-27 MED ORDER — PANTOPRAZOLE INFUSION (NEW) - SIMPLE MED
8.0000 mg/h | INTRAVENOUS | Status: DC
  Administered 2022-09-27 – 2022-09-29 (×7): 8 mg/h via INTRAVENOUS
  Filled 2022-09-27 (×8): qty 100

## 2022-09-27 MED ORDER — FAMOTIDINE IN NACL 20-0.9 MG/50ML-% IV SOLN
20.0000 mg | Freq: Once | INTRAVENOUS | Status: AC
  Administered 2022-09-27: 20 mg via INTRAVENOUS
  Filled 2022-09-27: qty 50

## 2022-09-27 MED ORDER — PROSIGHT PO TABS
1.0000 | ORAL_TABLET | Freq: Two times a day (BID) | ORAL | Status: DC
  Administered 2022-09-27: 1 via ORAL
  Filled 2022-09-27: qty 1

## 2022-09-27 MED ORDER — NITROGLYCERIN 0.4 MG SL SUBL: 0.4000 mg | SUBLINGUAL_TABLET | SUBLINGUAL | Status: DC | PRN

## 2022-09-27 MED ORDER — ACETAMINOPHEN 650 MG RE SUPP: 650.0000 mg | Freq: Four times a day (QID) | RECTAL | Status: DC | PRN

## 2022-09-27 MED ORDER — TAMSULOSIN HCL 0.4 MG PO CAPS
0.4000 mg | ORAL_CAPSULE | Freq: Every day | ORAL | Status: DC
  Administered 2022-09-27 – 2022-10-02 (×6): 0.4 mg via ORAL
  Filled 2022-09-27 (×7): qty 1

## 2022-09-27 MED ORDER — MAGNESIUM HYDROXIDE 400 MG/5ML PO SUSP: 30.0000 mL | Freq: Every day | ORAL | Status: DC | PRN

## 2022-09-27 MED ORDER — ONDANSETRON HCL 4 MG PO TABS: 4.0000 mg | ORAL_TABLET | Freq: Four times a day (QID) | ORAL | Status: DC | PRN

## 2022-09-27 MED ORDER — ISOSORBIDE MONONITRATE ER 30 MG PO TB24
30.0000 mg | ORAL_TABLET | Freq: Every day | ORAL | Status: DC
  Administered 2022-09-27 – 2022-10-03 (×6): 30 mg via ORAL
  Filled 2022-09-27 (×6): qty 1

## 2022-09-27 MED ORDER — IOHEXOL 350 MG/ML SOLN
75.0000 mL | Freq: Once | INTRAVENOUS | Status: AC | PRN
  Administered 2022-09-27: 75 mL via INTRAVENOUS

## 2022-09-27 MED ORDER — SODIUM CHLORIDE 0.9 % IV BOLUS
1000.0000 mL | Freq: Once | INTRAVENOUS | Status: AC
  Administered 2022-09-27: 1000 mL via INTRAVENOUS

## 2022-09-27 MED ORDER — SODIUM CHLORIDE 0.9% IV SOLUTION: Freq: Once | INTRAVENOUS | Status: DC

## 2022-09-27 MED ORDER — ACETAMINOPHEN 325 MG PO TABS: 650.0000 mg | ORAL_TABLET | Freq: Four times a day (QID) | ORAL | Status: DC | PRN

## 2022-09-27 MED ORDER — BOOST / RESOURCE BREEZE PO LIQD CUSTOM
1.0000 | Freq: Three times a day (TID) | ORAL | Status: DC
  Administered 2022-10-01 (×2): 1 via ORAL

## 2022-09-27 MED ORDER — FLUTICASONE PROPIONATE 50 MCG/ACT NA SUSP: 1.0000 | Freq: Two times a day (BID) | NASAL | Status: DC | PRN

## 2022-09-27 MED ORDER — POTASSIUM CHLORIDE CRYS ER 10 MEQ PO TBCR
10.0000 meq | EXTENDED_RELEASE_TABLET | Freq: Every day | ORAL | Status: DC
  Administered 2022-09-27 – 2022-10-03 (×5): 10 meq via ORAL
  Filled 2022-09-27 (×6): qty 1

## 2022-09-27 MED ORDER — ADULT MULTIVITAMIN W/MINERALS CH
1.0000 | ORAL_TABLET | Freq: Every day | ORAL | Status: DC
  Administered 2022-09-27 – 2022-10-03 (×5): 1 via ORAL
  Filled 2022-09-27 (×6): qty 1

## 2022-09-27 MED ORDER — FLUTICASONE FUROATE-VILANTEROL 200-25 MCG/ACT IN AEPB: 1.0000 | INHALATION_SPRAY | Freq: Every day | RESPIRATORY_TRACT | Status: DC

## 2022-09-27 MED ORDER — ONDANSETRON HCL 4 MG/2ML IJ SOLN: 4.0000 mg | Freq: Four times a day (QID) | INTRAMUSCULAR | Status: DC | PRN

## 2022-09-27 MED ORDER — FLUTICASONE FUROATE-VILANTEROL 200-25 MCG/ACT IN AEPB
1.0000 | INHALATION_SPRAY | Freq: Every day | RESPIRATORY_TRACT | Status: DC
  Administered 2022-09-28 – 2022-10-03 (×5): 1 via RESPIRATORY_TRACT
  Filled 2022-09-27 (×3): qty 28

## 2022-09-27 MED ORDER — LORATADINE 10 MG PO TABS
10.0000 mg | ORAL_TABLET | Freq: Every day | ORAL | Status: DC
  Administered 2022-09-27 – 2022-10-03 (×5): 10 mg via ORAL
  Filled 2022-09-27 (×6): qty 1

## 2022-09-27 MED ORDER — ONDANSETRON HCL 4 MG/2ML IJ SOLN
4.0000 mg | Freq: Once | INTRAMUSCULAR | Status: AC
  Administered 2022-09-27: 4 mg via INTRAVENOUS
  Filled 2022-09-27: qty 2

## 2022-09-27 MED ORDER — B COMPLEX-C PO TABS
1.0000 | ORAL_TABLET | Freq: Every day | ORAL | Status: DC
  Administered 2022-09-27 – 2022-10-03 (×5): 1 via ORAL
  Filled 2022-09-27 (×7): qty 1

## 2022-09-27 MED ORDER — LEVALBUTEROL HCL 0.63 MG/3ML IN NEBU
0.6300 mg | INHALATION_SOLUTION | Freq: Four times a day (QID) | RESPIRATORY_TRACT | Status: DC | PRN
  Administered 2022-09-30: 0.63 mg via RESPIRATORY_TRACT
  Filled 2022-09-27: qty 3

## 2022-09-27 MED ORDER — BIOFLEX PO TABS: ORAL_TABLET | Freq: Two times a day (BID) | ORAL | Status: DC

## 2022-09-27 MED ORDER — POTASSIUM CHLORIDE IN NACL 20-0.9 MEQ/L-% IV SOLN
INTRAVENOUS | Status: DC
  Filled 2022-09-27 (×4): qty 1000

## 2022-09-27 MED ORDER — PANTOPRAZOLE 80MG IVPB - SIMPLE MED
80.0000 mg | Freq: Once | INTRAVENOUS | Status: AC
  Administered 2022-09-27: 80 mg via INTRAVENOUS
  Filled 2022-09-27: qty 100

## 2022-09-27 MED ORDER — FAMOTIDINE 20 MG PO TABS: 40.0000 mg | ORAL_TABLET | Freq: Every day | ORAL | Status: DC

## 2022-09-27 MED ORDER — TRAZODONE HCL 50 MG PO TABS: 25.0000 mg | ORAL_TABLET | Freq: Every evening | ORAL | Status: DC | PRN

## 2022-09-27 MED ORDER — CARVEDILOL 3.125 MG PO TABS
3.1250 mg | ORAL_TABLET | Freq: Two times a day (BID) | ORAL | Status: DC
  Administered 2022-09-27 – 2022-10-03 (×12): 3.125 mg via ORAL
  Filled 2022-09-27 (×14): qty 1

## 2022-09-27 MED ORDER — ALBUTEROL SULFATE (2.5 MG/3ML) 0.083% IN NEBU: 2.5000 mg | INHALATION_SOLUTION | Freq: Four times a day (QID) | RESPIRATORY_TRACT | Status: DC | PRN

## 2022-09-27 MED ORDER — DONEPEZIL HCL 5 MG PO TABS
10.0000 mg | ORAL_TABLET | Freq: Every day | ORAL | Status: DC
  Administered 2022-09-27 – 2022-10-02 (×6): 10 mg via ORAL
  Filled 2022-09-27 (×7): qty 2

## 2022-09-27 MED ORDER — CHLORHEXIDINE GLUCONATE CLOTH 2 % EX PADS
6.0000 | MEDICATED_PAD | Freq: Every day | CUTANEOUS | Status: DC
  Administered 2022-09-27 – 2022-10-03 (×5): 6 via TOPICAL

## 2022-09-27 MED ORDER — VITAMIN D 25 MCG (1000 UNIT) PO TABS
1000.0000 [IU] | ORAL_TABLET | Freq: Every day | ORAL | Status: DC
  Administered 2022-09-27 – 2022-10-03 (×5): 1000 [IU] via ORAL
  Filled 2022-09-27 (×6): qty 1

## 2022-09-27 NOTE — Progress Notes (Signed)
Initial Nutrition Assessment  DOCUMENTATION CODES:   Obesity unspecified  INTERVENTION:   -Boost Breeze po TID, each supplement provides 250 kcal and 9 grams of protein  -MVI with minerals daily -RD will follow for diet advancement and add supplements as appropriate  NUTRITION DIAGNOSIS:   Inadequate oral intake related to altered GI function as evidenced by NPO status.  GOAL:   Patient will meet greater than or equal to 90% of their needs  MONITOR:   PO intake, Supplement acceptance, Diet advancement  REASON FOR ASSESSMENT:   Malnutrition Screening Tool    ASSESSMENT:   Pt with medical history significant for COPD, coronary artery disease, GERD, chronic bronchitis, OSA on CPAP, paroxysmal atrial fibrillation and osteoarthritis, who presented with acute onset of hematemesis  Pt admitted with upper GIB.   Reviewed I/O's: +50 ml x 24 hours  Per GI notes, plan for EGD tomorrow (09/28/22).   Pt somnolent at time of visit. Pt unable to provide history. No family at bedside.   Case discussed with safety sitter, who rep[orts pt was agitated earlier, but calm now. Pt consumed 100% of his clear liquids without difficulty. Sitter did not observe pt with any difficulty swallowing clear liquids.    Reviewed wt hx; pt has experienced a 7.1% wt loss over the past 11 months, which is not significant for time frame.   Medications reviewed and include vitamin D3 and 0.9% NaCl with KCl 20 mEq/L infusion @ 100 ml/hr.   Lab Results  Component Value Date   HGBA1C 5.6 04/29/2016   PTA DM medications are none.   Labs reviewed: CBGS: 121 (inpatient orders for glycemic control are none).    NUTRITION - FOCUSED PHYSICAL EXAM:  Flowsheet Row Most Recent Value  Orbital Region No depletion  Upper Arm Region No depletion  Thoracic and Lumbar Region No depletion  Buccal Region No depletion  Temple Region No depletion  Clavicle Bone Region No depletion  Clavicle and Acromion Bone Region  No depletion  Scapular Bone Region No depletion  Dorsal Hand No depletion  Patellar Region No depletion  Anterior Thigh Region No depletion  Posterior Calf Region No depletion  Edema (RD Assessment) None  Hair Reviewed  Eyes Reviewed  Mouth Reviewed  Skin Reviewed  Nails Reviewed       Diet Order:   Diet Order             Diet NPO time specified  Diet effective midnight           Diet clear liquid Room service appropriate? Yes; Fluid consistency: Thin  Diet effective now                   EDUCATION NEEDS:   Not appropriate for education at this time  Skin:  Skin Assessment: Skin Integrity Issues: Skin Integrity Issues:: Stage I Stage I: sacrum  Last BM:  09/27/22 (type 7)  Height:   Ht Readings from Last 1 Encounters:  09/27/22 5\' 1"  (1.549 m)    Weight:   Wt Readings from Last 1 Encounters:  09/27/22 79.5 kg    Ideal Body Weight:  50.9 kg  BMI:  Body mass index is 33.12 kg/m.  Estimated Nutritional Needs:   Kcal:  1500-1700  Protein:  75-90 grams  Fluid:  > 1.5 L    Loistine Chance, RD, LDN, [place] Registered Dietitian II Certified Diabetes Care and Education Specialist Please refer to [facility] for RD and/or RD on-call/weekend/after hours pager

## 2022-09-27 NOTE — Progress Notes (Addendum)
PROGRESS NOTE  Keith Estes CBJ:[id number] DOB: Apr 02, 1929   PCP: Sallee Lange, NP  Patient is from: Home.  Lives with family.  DOA: 09/27/2022 LOS: 0  Chief complaints Chief Complaint  Patient presents with   Hematemesis     Brief Narrative / Interim history:  89 year old M with PMH of dementia, COPD, CAD, OSA on CPAP, BPH, paroxysmal A-fib on Eliquis and GERD presenting with acute hematemesis and diarrhea for 2 days, and admitted for upper GI bleed, AKI and diarrhea.  In ED, vital signs stable.  Hgb 9.9 (from 11.0 in 12/2021). Cr 1.75 (baseline 0.8).  BUN 33.  Eliquis held.  Received IV fluid bolus and started on IV Protonix.  GI consulted.  Plan for EGD on 2/9.  Subjective: Seen and examined earlier this morning.  No major events overnight of this morning.  No complaints but not a great historian.  He is oriented to self and "hospital".  Not able to tell the name of the hospital.   Objective: Vitals:   09/27/22 0447 09/27/22 0545 09/27/22 0700 09/27/22 0911  BP:  124/88  122/77  Pulse:  70  70  Resp:  18  20  Temp: 99.1 F (37.3 C) (!) 97.5 F (36.4 C)  98.5 F (36.9 C)  TempSrc: Oral     SpO2:  100%  97%  Weight:   79.5 kg   Height:   5\' 1"  (1.549 m)     Examination:  GENERAL: No apparent distress.  Nontoxic. HEENT: MMM.  Vision and hearing grossly intact.  NECK: Supple.  No apparent JVD.  RESP:  No IWOB.  Fair aeration bilaterally. CVS:  RRR. Heart sounds normal.  ABD/GI/GU: BS+. Abd soft, NTND.  MSK/EXT:  Moves extremities. No apparent deformity. No edema.  SKIN: no apparent skin lesion or wound NEURO: Awake, alert and oriented to self and "hospital".  No apparent focal neuro deficit. PSYCH: Calm. Normal affect.   Procedures:  None  Microbiology summarized: [id number], influenza and RSV PCR nonreactive.  Assessment and plan: Principal Problem:   Upper GI bleeding Active Problems:   Paroxysmal atrial fibrillation (HCC)   AKI (acute  kidney injury) ([place])   Coronary artery disease   Chronic obstructive pulmonary disease (COPD) (HCC)   Essential hypertension   BPH (benign prostatic hyperplasia)   Dementia with behavioral disturbance (HCC)   Acute blood loss anemia   Pressure injury of skin   Diarrhea   Elevated troponin  Acute blood loss anemia due to upper GI bleeding: Presents with hematemesis. Recent Labs    12/17/21 1444 12/18/21 0537 09/27/22 0250 09/27/22 0441  HGB 11.5* 11.0* 9.9* 8.7*  -GI following-plan for EGD on 2/9. -Continue monitoring H&H -Check anemia panel -Continue IV Protonix -Continue holding liquids -Maintain 2 PIV. -CLD today and n.p.o. after midnight    AKI (acute kidney injury) [facility]): Due to diarrhea?  Seems to be on torsemide 10 mg daily at home. Recent Labs    12/17/21 1444 12/18/21 0537 09/27/22 0250 09/27/22 0441  BUN 22 18 33* 34*  CREATININE 1.11 0.87 1.75* 1.62*  -Continue IVF -Hold diuretics -Continue monitoring -Avoid nephrotoxins.  Diarrhea: Has no fever or leukocytosis.  Abdominal exam benign.  Likely from GI bleed   Paroxysmal atrial fibrillation [facility]): Rate controlled.  On Coreg and Eliquis at home. -Continue home Coreg.  It is low-dose. -Hold Eliquis   Elevated troponin/coronary artery disease: Elevated troponin likely demand ischemia and delayed clearance.  No significant delta to suggest ACS. -Continue  Imdur, Coreg and statin.   Dementia with behavioral disturbance [facility]): Patient is awake and alert but only oriented to self and "hospital".  Follows commands. -Continue home Aricept. -Reorientation and delirium precaution   BPH (benign prostatic hyperplasia) -Continue Flomax.   Essential hypertension -Cardiac meds as above.   Chronic obstructive pulmonary disease (COPD) (HCC) -Change albuterol to Xopenex as needed.   Obesity Body mass index is 33.12 kg/m.  Pressure skin injury: POA Pressure Injury 09/27/22 Buttocks Right;Left Stage 1 -  Intact  skin with non-blanchable redness of a localized area usually over a bony prominence. (Active)  09/27/22 0540  Location: Buttocks  Location Orientation: Right;Left  Staging: Stage 1 -  Intact skin with non-blanchable redness of a localized area usually over a bony prominence.  Wound Description (Comments):   Present on Admission: Yes   DVT prophylaxis:  SCDs Start: 09/27/22 0410  Code Status: DNR/DNI Family Communication: None at bedside Level of care: Telemetry Medical Status is: Inpatient Remains inpatient appropriate because: ABLA due to upper GI bleed   Final disposition:  Consultants:  Gastroenterology  55 minutes with more than 50% spent in reviewing records, counseling patient/family and coordinating care.   Sch Meds:  Scheduled Meds:  B-complex with vitamin C  1 tablet Oral Daily   carvedilol  3.125 mg Oral BID WC   cholecalciferol  1,000 Units Oral Daily   donepezil  10 mg Oral QHS   isosorbide mononitrate  30 mg Oral Daily   loratadine  10 mg Oral Daily   multivitamin  1 tablet Oral BID   potassium chloride  10 mEq Oral Daily   tamsulosin  0.4 mg Oral QPC supper   traZODone  50 mg Oral QHS   Continuous Infusions:  0.9 % NaCl with KCl 20 mEq / L 100 mL/hr at 09/27/22 0434   pantoprazole 8 mg/hr (09/27/22 0505)   PRN Meds:.acetaminophen **OR** acetaminophen, fluticasone, levalbuterol, magnesium hydroxide, nitroGLYCERIN, ondansetron **OR** ondansetron (ZOFRAN) IV, traZODone  Antimicrobials: Anti-infectives (From admission, onward)    None        I have personally reviewed the following labs and images: CBC: Recent Labs  Lab 09/27/22 0250 09/27/22 0441  WBC 7.0 6.0  NEUTROABS 5.1  --   HGB 9.9* 8.7*  HCT 31.2* 28.0*  MCV 92.9 92.7  PLT 195 178   BMP &GFR Recent Labs  Lab 09/27/22 0250 09/27/22 0441  NA 142 140  K 3.8 3.8  CL 108 111  CO2 22 21*  GLUCOSE 114* 100*  BUN 33* 34*  CREATININE 1.75* 1.62*  CALCIUM 8.5* 8.1*   Estimated  Creatinine Clearance: 26.6 mL/min (A) (by C-G formula based on SCr of 1.62 mg/dL (H)). Liver & Pancreas: Recent Labs  Lab 09/27/22 0250  AST 29  ALT 20  ALKPHOS 81  BILITOT 1.3*  PROT 7.1  ALBUMIN 3.5   Recent Labs  Lab 09/27/22 0250  LIPASE 37   No results for input(s): "AMMONIA" in the last 168 hours. Diabetic: No results for input(s): "HGBA1C" in the last 72 hours. No results for input(s): "GLUCAP" in the last 168 hours. Cardiac Enzymes: No results for input(s): "CKTOTAL", "CKMB", "CKMBINDEX", "TROPONINI" in the last 168 hours. No results for input(s): "PROBNP" in the last 8760 hours. Coagulation Profile: Recent Labs  Lab 09/27/22 0250  INR 1.8*   Thyroid Function Tests: No results for input(s): "TSH", "T4TOTAL", "FREET4", "T3FREE", "THYROIDAB" in the last 72 hours. Lipid Profile: No results for input(s): "CHOL", "HDL", "LDLCALC", "TRIG", "CHOLHDL", "LDLDIRECT"  in the last 72 hours. Anemia Panel: No results for input(s): "VITAMINB12", "FOLATE", "FERRITIN", "TIBC", "IRON", "RETICCTPCT" in the last 72 hours. Urine analysis:    Component Value Date/Time   COLORURINE YELLOW (A) 12/17/2021 1616   APPEARANCEUR CLEAR (A) 12/17/2021 1616   LABSPEC 1.015 12/17/2021 1616   PHURINE 5.0 12/17/2021 1616   GLUCOSEU NEGATIVE 12/17/2021 1616   HGBUR NEGATIVE 12/17/2021 1616   BILIRUBINUR NEGATIVE 12/17/2021 1616   KETONESUR NEGATIVE 12/17/2021 1616   PROTEINUR NEGATIVE 12/17/2021 1616   NITRITE NEGATIVE 12/17/2021 1616   LEUKOCYTESUR NEGATIVE 12/17/2021 1616   Sepsis Labs: Invalid input(s): "PROCALCITONIN", "LACTICIDVEN"  Microbiology: Recent Results (from the past 240 hour(s))  Resp panel by RT-PCR (RSV, Flu A&B, Covid) Anterior Nasal Swab     Status: None   Collection Time: 09/27/22  2:50 AM   Specimen: Anterior Nasal Swab  Result Value Ref Range Status   SARS Coronavirus 2 by RT PCR NEGATIVE NEGATIVE Final    Comment: (NOTE) SARS-CoV-2 target nucleic acids are NOT  DETECTED.  The SARS-CoV-2 RNA is generally detectable in upper respiratory specimens during the acute phase of infection. The lowest concentration of SARS-CoV-2 viral copies this assay can detect is 138 copies/mL. A negative result does not preclude SARS-Cov-2 infection and should not be used as the sole basis for treatment or other patient management decisions. A negative result may occur with  improper specimen collection/handling, submission of specimen other than nasopharyngeal swab, presence of viral mutation(s) within the areas targeted by this assay, and inadequate number of viral copies(<138 copies/mL). A negative result must be combined with clinical observations, patient history, and epidemiological information. The expected result is Negative.  Fact Sheet for Patients:  EntrepreneurPulse.com.au  Fact Sheet for Healthcare Providers:  IncredibleEmployment.be  This test is no t yet approved or cleared by the [place] FDA and  has been authorized for detection and/or diagnosis of SARS-CoV-2 by FDA under an Emergency Use Authorization (EUA). This EUA will remain  in effect (meaning this test can be used) for the duration of the COVID-19 declaration under Section 564(b)(1) of the Act, 21 U.S.C.section 360bbb-3(b)(1), unless the authorization is terminated  or revoked sooner.       Influenza A by PCR NEGATIVE NEGATIVE Final   Influenza B by PCR NEGATIVE NEGATIVE Final    Comment: (NOTE) The Xpert Xpress SARS-CoV-2/FLU/RSV plus assay is intended as an aid in the diagnosis of influenza from Nasopharyngeal swab specimens and should not be used as a sole basis for treatment. Nasal washings and aspirates are unacceptable for Xpert Xpress SARS-CoV-2/FLU/RSV testing.  Fact Sheet for Patients: EntrepreneurPulse.com.au  Fact Sheet for Healthcare Providers: IncredibleEmployment.be  This test is not yet  approved or cleared by the [place] FDA and has been authorized for detection and/or diagnosis of SARS-CoV-2 by FDA under an Emergency Use Authorization (EUA). This EUA will remain in effect (meaning this test can be used) for the duration of the COVID-19 declaration under Section 564(b)(1) of the Act, 21 U.S.C. section 360bbb-3(b)(1), unless the authorization is terminated or revoked.     Resp Syncytial Virus by PCR NEGATIVE NEGATIVE Final    Comment: (NOTE) Fact Sheet for Patients: EntrepreneurPulse.com.au  Fact Sheet for Healthcare Providers: IncredibleEmployment.be  This test is not yet approved or cleared by the [place] FDA and has been authorized for detection and/or diagnosis of SARS-CoV-2 by FDA under an Emergency Use Authorization (EUA). This EUA will remain in effect (meaning this test can be used) for the duration  of the COVID-19 declaration under Section 564(b)(1) of the Act, 21 U.S.C. section 360bbb-3(b)(1), unless the authorization is terminated or revoked.  Performed at [facility], [address], [place], [place] [address]     Radiology Studies: [facility] Chest [place] 1 View  Result Date: 09/27/2022 CLINICAL DATA:  Vomiting blood since 1,800. Patient is on Eliquis. Weakness. EXAM: PORTABLE CHEST 1 VIEW COMPARISON:  02/22/2018 FINDINGS: Cardiac pacemaker. Shallow inspiration. Heart size and pulmonary vascularity are normal. No airspace disease or consolidation in the lungs. No pleural effusions. No pneumothorax. Mediastinal contours appear intact. Old appearing right rib fractures. IMPRESSION: Shallow inspiration.  No evidence of active pulmonary disease. Electronically Signed   By: Burman Nieves M.D.   On: 09/27/2022 03:25      Culver Feighner T. Dayzha Pogosyan [facility]  If 7PM-7AM, please contact night-coverage [email or web address] 09/27/2022, 10:50 AM

## 2022-09-27 NOTE — Assessment & Plan Note (Signed)
-   We will continue Aricept. - A sitter for safety observation will be ordered.
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
## 2022-09-27 NOTE — Progress Notes (Signed)
PHARMACIST - PHYSICIAN ORDER COMMUNICATION  CONCERNING: P&T Medication Policy on Herbal Medications  DESCRIPTION:  This patient's order(s) for: Bioflex Tabs has been noted.  This product(s) is classified as an "herbal" or natural product. Due to a lack of definitive safety studies or FDA approval, nonstandard manufacturing practices, plus the potential risk of unknown drug-drug interactions while on inpatient medications, the Pharmacy and Therapeutics Committee does not permit the use of "herbal" or natural products of this type within [facility].   ACTION TAKEN: The pharmacy department is unable to verify this order at this time.  Please reevaluate patient's clinical condition at discharge and address if the herbal or natural product(s) should be resumed at that time.  Renda Rolls, PharmD, [facility] 09/27/2022 5:08 AM

## 2022-09-27 NOTE — Progress Notes (Signed)
Patient was transferred to ICU bed 07 per MD order after going to CT, daughter is aware and went with patient and RN to ICU.  Report was called to [facility]  ICU RN.

## 2022-09-27 NOTE — Assessment & Plan Note (Signed)
Will continue Coreg and Imdur.

## 2022-09-27 NOTE — Assessment & Plan Note (Signed)
-   We will continue his as needed nebulized albuterol.
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
## 2022-09-27 NOTE — Assessment & Plan Note (Signed)
-   We will continue amiodarone and hold off Eliquis.

## 2022-09-27 NOTE — Assessment & Plan Note (Signed)
-   This is likely prerenal due to volume depletion with his GI bleeding. - We will continue hydration with IV normal saline and follow BMP. - Will avoid nephrotoxins.

## 2022-09-27 NOTE — Progress Notes (Signed)
Recurrent hematochezia with blood clot this afternoon.   Hgb trended down to 7.7 about noon. Hemodynamically stable.  Somewhat lethargic.  Patient's daughter concerned.  Vitals: Vitals:   09/27/22 0700 09/27/22 0911 09/27/22 1131 09/27/22 1537  BP:  122/77 115/71 123/74  Pulse:  70 71 71  Resp:  20 18 16   Temp:  98.5 F (36.9 C) 98.2 F (36.8 C) 98 F (36.7 C)  TempSrc:      SpO2:  97% 97% 99%  Weight: 79.5 kg     Height: 5\' 1"  (1.549 m)        Plan Ordered stat CT angio and CBC.  Transfuse 2 units and hold additional 2 units. Transfer to stepdown unit Advised RN to notify GI

## 2022-09-27 NOTE — Assessment & Plan Note (Signed)
-   We will continue with antihypertensives.

## 2022-09-27 NOTE — ED Triage Notes (Signed)
BIB [facility]  from home with c/o vomiting blood since 1800. Pt take Eliquis. Pt with hx of dementia. Family reports pt is weak. Family also stated pt is at neuro baseline. Pt alert on arrival following commands. Breathing unlabored with symmetric chest rise and fall. EMS reports that numerous blood clots were noted in pt toilet, on floor, and on walls at home. Also report gross amount of frank red blood.   HR 77 BGL 150 98.3 axillary 140/80

## 2022-09-27 NOTE — Assessment & Plan Note (Signed)
-   We will continue Flomax. 

## 2022-09-27 NOTE — Assessment & Plan Note (Addendum)
-   The patient will be admitted to a medical telemetry bed. - We will follow serial H&H. - If stopped and crossmatched. - We will continue IV Protonix drip. - We will continue H2 blocker therapy. - We will hold off Eliquis.  The patient is currently hemodynamically stable.  At this time I do not see urgent need for reversal. - GI consult will be obtained. - I notified Dr. Alice Reichert about the patient.

## 2022-09-27 NOTE — Care Management Important Message (Signed)
Important Message  Patient Details  Name: Keith Estes MRN: [id number] Date of Birth: 01/01/1929   Medicare Important Message Given:  N/A - LOS <3 / Initial given by admissions     [name] 09/27/2022, 2:35 PM

## 2022-09-28 ENCOUNTER — Inpatient Hospital Stay: Payer: Medicare Other | Admitting: Anesthesiology

## 2022-09-28 ENCOUNTER — Encounter
Admission: EM | Disposition: A | Discharge status: Hospice | Payer: Self-pay | Source: Home / Self Care | Attending: Internal Medicine

## 2022-09-28 ENCOUNTER — Encounter: Payer: Self-pay | Admitting: Family Medicine

## 2022-09-28 DIAGNOSIS — N1330 Unspecified hydronephrosis: Secondary | ICD-10-CM | POA: Diagnosis not present

## 2022-09-28 DIAGNOSIS — N179 Acute kidney failure, unspecified: Secondary | ICD-10-CM | POA: Diagnosis not present

## 2022-09-28 DIAGNOSIS — I2510 Atherosclerotic heart disease of native coronary artery without angina pectoris: Secondary | ICD-10-CM | POA: Diagnosis not present

## 2022-09-28 DIAGNOSIS — N134 Hydroureter: Secondary | ICD-10-CM

## 2022-09-28 DIAGNOSIS — K922 Gastrointestinal hemorrhage, unspecified: Secondary | ICD-10-CM | POA: Diagnosis not present

## 2022-09-28 DIAGNOSIS — I701 Atherosclerosis of renal artery: Secondary | ICD-10-CM | POA: Insufficient documentation

## 2022-09-28 DIAGNOSIS — I482 Chronic atrial fibrillation, unspecified: Secondary | ICD-10-CM

## 2022-09-28 DIAGNOSIS — N400 Enlarged prostate without lower urinary tract symptoms: Secondary | ICD-10-CM

## 2022-09-28 HISTORY — PX: ESOPHAGOGASTRODUODENOSCOPY (EGD) WITH PROPOFOL: SHX5813

## 2022-09-28 LAB — RENAL FUNCTION PANEL
Albumin: 2.7 g/dL — ABNORMAL LOW (ref 3.5–5.0)
Anion gap: 6 (ref 5–15)
BUN: 43 mg/dL — ABNORMAL HIGH (ref 8–23)
CO2: 18 mmol/L — ABNORMAL LOW (ref 22–32)
Calcium: 7.8 mg/dL — ABNORMAL LOW (ref 8.9–10.3)
Chloride: 117 mmol/L — ABNORMAL HIGH (ref 98–111)
Creatinine, Ser: 1.43 mg/dL — ABNORMAL HIGH (ref 0.61–1.24)
GFR, Estimated: 46 mL/min — ABNORMAL LOW (ref >60)
Glucose, Bld: 97 mg/dL (ref 70–99)
Phosphorus: 2.5 mg/dL (ref 2.5–4.6)
Potassium: 4.3 mmol/L (ref 3.5–5.1)
Sodium: 141 mmol/L (ref 135–145)

## 2022-09-28 LAB — BPAM RBC
Blood Product Expiration Date: 202403162359
Blood Product Expiration Date: 202403172359
ISSUE DATE / TIME: 202402081836
ISSUE DATE / TIME: 202402082144
Unit Type and Rh: 5100
Unit Type and Rh: 5100

## 2022-09-28 LAB — CBC
HCT: 25.2 % — ABNORMAL LOW (ref 39.0–52.0)
Hemoglobin: 8 g/dL — ABNORMAL LOW (ref 13.0–17.0)
MCH: 29 pg (ref 26.0–34.0)
MCHC: 31.7 g/dL (ref 30.0–36.0)
MCV: 91.3 fL (ref 80.0–100.0)
Platelets: 148 10*3/uL — ABNORMAL LOW (ref 150–400)
RBC: 2.76 MIL/uL — ABNORMAL LOW (ref 4.22–5.81)
RDW: 17.6 % — ABNORMAL HIGH (ref 11.5–15.5)
WBC: 4.5 10*3/uL (ref 4.0–10.5)
nRBC: 0 % (ref 0.0–0.2)

## 2022-09-28 LAB — TYPE AND SCREEN
ABO/RH(D): O POS
Antibody Screen: NEGATIVE
Unit division: 0
Unit division: 0

## 2022-09-28 LAB — HEMOGLOBIN AND HEMATOCRIT, BLOOD
HCT: 26.7 % — ABNORMAL LOW (ref 39.0–52.0)
HCT: 25.6 % — ABNORMAL LOW (ref 39.0–52.0)
Hemoglobin: 8.5 g/dL — ABNORMAL LOW (ref 13.0–17.0)
Hemoglobin: 8 g/dL — ABNORMAL LOW (ref 13.0–17.0)

## 2022-09-28 LAB — MAGNESIUM: Magnesium: 2.3 mg/dL (ref 1.7–2.4)

## 2022-09-28 SURGERY — ESOPHAGOGASTRODUODENOSCOPY (EGD) WITH PROPOFOL
Anesthesia: General

## 2022-09-28 MED ORDER — FENTANYL CITRATE (PF) 100 MCG/2ML IJ SOLN
INTRAMUSCULAR | Status: AC
  Filled 2022-09-28: qty 2

## 2022-09-28 MED ORDER — VITAMIN B-12 1000 MCG PO TABS
1000.0000 ug | ORAL_TABLET | Freq: Every day | ORAL | Status: DC
  Administered 2022-09-30 – 2022-10-03 (×4): 1000 ug via ORAL
  Filled 2022-09-28 (×5): qty 1

## 2022-09-28 MED ORDER — BISACODYL 10 MG RE SUPP: 10.0000 mg | Freq: Once | RECTAL | Status: DC

## 2022-09-28 MED ORDER — CYANOCOBALAMIN 1000 MCG/ML IJ SOLN
1000.0000 ug | Freq: Every day | INTRAMUSCULAR | Status: AC
  Administered 2022-09-28 – 2022-09-29 (×2): 1000 ug via INTRAMUSCULAR
  Filled 2022-09-28 (×2): qty 1

## 2022-09-28 MED ORDER — SODIUM CHLORIDE 0.9 % IV SOLN: INTRAVENOUS | Status: DC

## 2022-09-28 MED ORDER — LIDOCAINE HCL (CARDIAC) PF 100 MG/5ML IV SOSY
PREFILLED_SYRINGE | INTRAVENOUS | Status: DC | PRN
  Administered 2022-09-28: 80 mg via INTRAVENOUS

## 2022-09-28 MED ORDER — DEXMEDETOMIDINE HCL IN NACL 80 MCG/20ML IV SOLN
INTRAVENOUS | Status: DC | PRN
  Administered 2022-09-28: 8 ug via BUCCAL

## 2022-09-28 MED ORDER — SODIUM CHLORIDE 0.9 % IV SOLN
250.0000 mg | Freq: Every day | INTRAVENOUS | Status: AC
  Administered 2022-09-28 – 2022-09-29 (×2): 250 mg via INTRAVENOUS
  Filled 2022-09-28 (×2): qty 20

## 2022-09-28 MED ORDER — PEG 3350-KCL-NA BICARB-NACL 420 G PO SOLR
4000.0000 mL | Freq: Once | ORAL | Status: AC
  Administered 2022-09-28: 4000 mL via ORAL
  Filled 2022-09-28: qty 4000

## 2022-09-28 MED ORDER — BISACODYL 5 MG PO TBEC: 10.0000 mg | DELAYED_RELEASE_TABLET | Freq: Once | ORAL | Status: DC

## 2022-09-28 MED ORDER — LACTATED RINGERS IV SOLN: INTRAVENOUS | Status: DC

## 2022-09-28 MED ORDER — PROPOFOL 10 MG/ML IV BOLUS
INTRAVENOUS | Status: DC | PRN
  Administered 2022-09-28: 30 mg via INTRAVENOUS
  Administered 2022-09-28: 20 mg via INTRAVENOUS

## 2022-09-28 NOTE — Transfer of Care (Signed)
Immediate Anesthesia Transfer of Care Note  Patient: Keith Estes  Procedure(s) Performed: ESOPHAGOGASTRODUODENOSCOPY (EGD) WITH PROPOFOL  Patient Location: PACU  Anesthesia Type:General  Level of Consciousness: awake and alert  Airway & Oxygen Therapy: Patient Spontanous Breathing and Patient connected to face mask oxygen  Post-op Assessment: Report given to RN and Post -op Vital signs reviewed and stable  Post vital signs: Reviewed and stable  Last Vitals:  Vitals Value Taken Time  BP    Temp    Pulse    Resp    SpO2      Last Pain:  Vitals:   09/28/22 1230  TempSrc: Temporal  PainSc: 0-No pain         Complications: No notable events documented.
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
## 2022-09-28 NOTE — Anesthesia Procedure Notes (Signed)
Procedure Name: MAC Date/Time: 09/28/2022 12:40 PM  Performed by: Jerrye Noble, CRNAPre-anesthesia Checklist: Patient identified, Emergency Drugs available, Suction available and Patient being monitored Patient Re-evaluated:Patient Re-evaluated prior to induction Oxygen Delivery Method: Simple face mask

## 2022-09-28 NOTE — Progress Notes (Signed)
   09/28/22 1400  Spiritual Encounters  Type of Visit Initial  Care provided to: Pt and family  Referral source IDT Rounds  Reason for visit Routine spiritual support  OnCall Visit No  Spiritual Framework  Presenting Themes Significant life change  Patient Stress Factors None identified  Family Stress Factors None identified  Interventions  Spiritual Care Interventions Made Established relationship of care and support;Compassionate presence;Reflective listening;Prayer  Spiritual Care Plan  Spiritual Care Issues Still Outstanding Chaplain will continue to follow   Prayer for patient and patient daughter educate them on how to contact Chaplain if needed. Advise them that a chaplain is here 24/7 if needed.

## 2022-09-28 NOTE — Op Note (Signed)
[facility] Gastroenterology Patient Name: Keith Estes Procedure Date: 09/28/2022 12:38 PM MRN: [id number] Account #: [id number] Date of Birth: [birth year] Admit Type: Inpatient Age: 89 Room: [facility] ENDO ROOM 4 Gender: Male Note Status: Finalized Instrument Name: Upper Endoscope [id number] Procedure:             Upper GI endoscopy Indications:           Acute post hemorrhagic anemia, Hematemesis, Melena Providers:             Benay Pike. Alice Reichert MD, MD Referring MD:          Juluis Rainier (Referring MD) Medicines:             Propofol per Anesthesia Complications:         No immediate complications. Estimated blood loss: None. Procedure:             Pre-Anesthesia Assessment:                        - The risks and benefits of the procedure and the                         sedation options and risks were discussed with the                         patient. All questions were answered and informed                         consent was obtained.                        - Patient identification and proposed procedure were                         verified prior to the procedure by the nurse. The                         procedure was verified in the procedure room.                        - ASA Grade Assessment: III - A patient with severe                         systemic disease.                        - After reviewing the risks and benefits, the patient                         was deemed in satisfactory condition to undergo the                         procedure.                        After obtaining informed consent, the endoscope was                         passed under direct vision. Throughout the procedure,  the patient's blood pressure, pulse, and oxygen                         saturations were monitored continuously. The Endoscope                         was introduced through the mouth, and advanced to the                         third part of  duodenum. The upper GI endoscopy was                         accomplished without difficulty. The patient tolerated                         the procedure well. Findings:      A 14 mm non-bleeding Mallory-Weiss tear with no stigmata of recent       bleeding was found.      The exam of the esophagus was otherwise normal.      The stomach was normal.      The examined duodenum was normal. Impression:            - Mallory-Weiss tear.                        - Normal stomach.                        - Normal examined duodenum.                        - No specimens collected. Recommendation:        - Return patient to ICU for observation.                        - Serial H/H, Serial exams                        - Clear liquid diet.                        - Perform a colonoscopy tomorrow.                        - Will write orders.                        Dr. Marius Ditch on call and will perform colonoscopy                         tomorrow, if feasible.                        - The findings and recommendations were discussed with                         the patient's family. Procedure Code(s):     --- Professional ---                        [phone], Esophagogastroduodenoscopy, flexible,  transoral; diagnostic, including collection of                         specimen(s) by brushing or washing, when performed                         (separate procedure) Diagnosis Code(s):     --- Professional ---                        K92.1, Melena (includes Hematochezia)                        K92.0, Hematemesis                        D62, Acute posthemorrhagic anemia                        K22.6, Gastro-esophageal laceration-hemorrhage syndrome CPT copyright 2022 American Medical Association. All rights reserved. The codes documented in this report are preliminary and upon coder review may  be revised to meet current compliance requirements. Efrain Sella MD, MD 09/28/2022 1:00:31 PM This  report has been signed electronically. Number of Addenda: 0 Note Initiated On: 09/28/2022 12:38 PM Estimated Blood Loss:  Estimated blood loss: none.      [facility]
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
## 2022-09-28 NOTE — Progress Notes (Signed)
PROGRESS NOTE  Keith Estes [id number] DOB: 1929-06-10   PCP: Sallee Lange, NP  Patient is from: Home.  Lives with family.  DOA: 09/27/2022 LOS: 1  Chief complaints Chief Complaint  Patient presents with   Hematemesis     Brief Narrative / Interim history: 89 year old M with PMH of dementia, COPD, CAD, OSA on CPAP, BPH, paroxysmal A-fib on Eliquis and GERD presenting with acute hematemesis and diarrhea for 2 days, and admitted for upper GI bleed, AKI and diarrhea.  In ED, vital signs stable.  Hgb 9.9 (from 11.0 in 12/2021). Cr 1.75 (baseline 0.8).  BUN 33.  Eliquis held.  Received IV fluid bolus and started on IV Protonix.  GI consulted.  The next day, patient had multiple bloody bowel movements with blood clots.  Hgb dropped to 7.7.  CT angio without extravasation but incidental finding of moderate bilateral hydronephrosis and some irregularity and asymmetry of prostate gland, mild to moderate stenosis of both main renal arteries.  Patient was transferred to stepdown unit and transfused 2 units.  Patient underwent EGD on 2/9 that showed Mallory-Weiss tear.  Plan for colonoscopy on 2/10.   Subjective: Seen and examined earlier this morning before he went down for endoscopy.  Reportedly had 2 bloody bowel movements earlier last night and again this morning.  Hemodynamically stable.  Hemoglobin went down from 7.7-8.0 after 2 units of blood.  Patient is not a great historian.  He is only oriented to self.  Does not appear to be in distress.  Objective: Vitals:   09/28/22 1309 09/28/22 1319 09/28/22 1329 09/28/22 1345  BP: 100/74 115/70 127/85 (!) 140/87  Pulse: 72 71  70  Resp: 15 (!) 21  16  Temp:      TempSrc:      SpO2: 99% 98%  100%  Weight:      Height:        Examination:  GENERAL: No apparent distress.  Nontoxic. HEENT: MMM.  Vision grossly intact.  Diminished hearing. NECK: Supple.  No apparent JVD.  RESP:  No IWOB.  Fair aeration bilaterally. CVS:   RRR. Heart sounds normal.  ABD/GI/GU: BS+. Abd soft, NTND.  MSK/EXT:   No apparent deformity. Moves extremities. No edema.  SKIN: no apparent skin lesion or wound NEURO: Awake and alert. Oriented to self.  Follows commands.  No apparent focal neuro deficit. PSYCH: Calm. Normal affect.   Procedures:  2/9-EGD showed Mallory-Weiss tear  Microbiology summarized: [id number], influenza and RSV PCR nonreactive.  Assessment and plan: Principal Problem:   GI bleed Active Problems:   Chronic a-fib (HCC)   AKI (acute kidney injury) ([place])   Coronary artery disease   Chronic obstructive pulmonary disease (COPD) (HCC)   Essential hypertension   BPH (benign prostatic hyperplasia)   Dementia with behavioral disturbance (HCC)   Acute blood loss anemia   Pressure injury of skin   Diarrhea   Elevated troponin   Bilateral hydronephrosis   Renal artery stenosis, native, bilateral (HCC)  Acute blood loss anemia due to GI bleeding: Initially presented with hematemesis now with recurrent bloody bowel movements with blood clots.  Anemia panel with iron and B12 deficiency.  CT angio without extravasation.  EGD showed Mallory-Weiss tear.  Transfused 2 units on 2/8. Recent Labs    12/17/21 1444 12/18/21 0537 09/27/22 0250 09/27/22 0441 09/27/22 1212 09/27/22 1653 09/28/22 0448  HGB 11.5* 11.0* 9.9* 8.7* 7.7* 7.7* 8.0*  -GI following-plan for colonoscopy on 2/10. -Continue monitoring H&H every 6  hours.  Transfuse for Hgb less than 8.  History of CAD. -Vitamin B12 injection 1000 mcg daily x 2 followed by p.o. -IV 250 mg x 2 -Continue IV Protonix -Continue holding liquids -Maintain 2 PIV. -CLD and n.p.o. after midnight per GI  AKI (acute kidney injury) [facility]): Due to diarrhea?  Seems to be on torsemide 10 mg daily at home. Recent Labs    12/17/21 1444 12/18/21 0537 09/27/22 0250 09/27/22 0441 09/28/22 0448  BUN 22 18 33* 34* 43*  CREATININE 1.11 0.87 1.75* 1.62* 1.43*  -Continue IVF.   Change NS to LR due to metabolic acidosis -Continue holding diuretics. -Continue monitoring -Avoid nephrotoxins.  Bilateral hydronephrosis/BPH: Incidental finding on CT.  AKI improving. -Urology consulted.  Family likes to defer Foley catheter. -Monitor urine output -Monitor renal function -Continue home Flomax -Follow-up PSA -Outpatient follow-up with urology  Bilateral renal artery stenosis: CT angio showed mild to moderate bilateral renal stenosis.  Incidental finding. -Outpatient follow-up with vascular surgery  Diarrhea: Has no fever or leukocytosis.  Abdominal exam benign.  Likely from GI bleed   Paroxysmal atrial fibrillation [facility]): Rate controlled.  On Coreg and Eliquis at home. -Continue home Coreg.  It is low-dose. -Hold Eliquis   Elevated troponin/coronary artery disease: Elevated troponin likely demand ischemia and delayed clearance.  No significant delta to suggest ACS. -Continue Imdur, Coreg and statin.   Dementia with behavioral disturbance [facility]): Patient is awake and alert but only oriented to self and "hospital".  Follows commands.  No agitation. -Continue home Aricept. -Reorientation and delirium precaution   Essential hypertension -Cardiac meds as above.   Chronic obstructive pulmonary disease (COPD) (HCC) -Change albuterol to Xopenex as needed.   Obesity Body mass index is 33.12 kg/m.  Pressure skin injury: POA Pressure Injury 09/27/22 Buttocks Right;Left Stage 1 -  Intact skin with non-blanchable redness of a localized area usually over a bony prominence. (Active)  09/27/22 0540  Location: Buttocks  Location Orientation: Right;Left  Staging: Stage 1 -  Intact skin with non-blanchable redness of a localized area usually over a bony prominence.  Wound Description (Comments):   Present on Admission: Yes  Dressing Type Foam - Lift dressing to assess site every shift 09/28/22 0830   DVT prophylaxis:  SCDs Start: 09/27/22 0410  Code Status:  DNR/DNI Family Communication: Updated patient's daughter at bedside. Level of care: Stepdown Status is: Inpatient Remains inpatient appropriate because: ABLA due to GI bleed.   Final disposition:  Consultants:  Gastroenterology Urology  55 minutes with more than 50% spent in reviewing records, counseling patient/family and coordinating care.   Sch Meds:  Scheduled Meds:  sodium chloride   Intravenous Once   B-complex with vitamin C  1 tablet Oral Daily   bisacodyl  10 mg Oral Once   bisacodyl  10 mg Rectal Once   carvedilol  3.125 mg Oral BID WC   Chlorhexidine Gluconate Cloth  6 each Topical Daily   cholecalciferol  1,000 Units Oral Daily   cyanocobalamin  1,000 mcg Intramuscular Daily   Followed by   Derrill Memo ON 09/30/2022] vitamin B-12  1,000 mcg Oral Daily   donepezil  10 mg Oral QHS   feeding supplement  1 Container Oral TID BM   fluticasone furoate-vilanterol  1 puff Inhalation Daily   isosorbide mononitrate  30 mg Oral Daily   loratadine  10 mg Oral Daily   multivitamin with minerals  1 tablet Oral Daily   polyethylene glycol-electrolytes  4,000 mL Oral Once  potassium chloride  10 mEq Oral Daily   tamsulosin  0.4 mg Oral QPC supper   traZODone  50 mg Oral QHS   Continuous Infusions:  sodium chloride     lactated ringers 100 mL/hr at 09/28/22 1237   pantoprazole 8 mg/hr (09/28/22 1122)   PRN Meds:.acetaminophen **OR** acetaminophen, fluticasone, levalbuterol, magnesium hydroxide, nitroGLYCERIN, ondansetron **OR** ondansetron (ZOFRAN) IV, traZODone  Antimicrobials: Anti-infectives (From admission, onward)    None        I have personally reviewed the following labs and images: CBC: Recent Labs  Lab 09/27/22 0250 09/27/22 0441 09/27/22 1212 09/27/22 1653 09/28/22 0448  WBC 7.0 6.0  --  4.8 4.5  NEUTROABS 5.1  --   --   --   --   HGB 9.9* 8.7* 7.7* 7.7* 8.0*  HCT 31.2* 28.0* 24.5* 24.3* 25.2*  MCV 92.9 92.7  --  92.7 91.3  PLT 195 178  --  166  148*   BMP &GFR Recent Labs  Lab 09/27/22 0250 09/27/22 0441 09/28/22 0448  NA 142 140 141  K 3.8 3.8 4.3  CL 108 111 117*  CO2 22 21* 18*  GLUCOSE 114* 100* 97  BUN 33* 34* 43*  CREATININE 1.75* 1.62* 1.43*  CALCIUM 8.5* 8.1* 7.8*  MG  --   --  2.3  PHOS  --   --  2.5   Estimated Creatinine Clearance: 30.1 mL/min (A) (by C-G formula based on SCr of 1.43 mg/dL (H)). Liver & Pancreas: Recent Labs  Lab 09/27/22 0250 09/28/22 0448  AST 29  --   ALT 20  --   ALKPHOS 81  --   BILITOT 1.3*  --   PROT 7.1  --   ALBUMIN 3.5 2.7*   Recent Labs  Lab 09/27/22 0250  LIPASE 37   No results for input(s): "AMMONIA" in the last 168 hours. Diabetic: No results for input(s): "HGBA1C" in the last 72 hours. Recent Labs  Lab 09/27/22 1753  GLUCAP 105*   Cardiac Enzymes: No results for input(s): "CKTOTAL", "CKMB", "CKMBINDEX", "TROPONINI" in the last 168 hours. No results for input(s): "PROBNP" in the last 8760 hours. Coagulation Profile: Recent Labs  Lab 09/27/22 0250  INR 1.8*   Thyroid Function Tests: No results for input(s): "TSH", "T4TOTAL", "FREET4", "T3FREE", "THYROIDAB" in the last 72 hours. Lipid Profile: No results for input(s): "CHOL", "HDL", "LDLCALC", "TRIG", "CHOLHDL", "LDLDIRECT" in the last 72 hours. Anemia Panel: Recent Labs    09/27/22 1648 09/27/22 1653  VITAMINB12 132*  --   FOLATE  --  13.7  FERRITIN  --  16*  TIBC  --  304  IRON  --  37*  RETICCTPCT 1.8  --    Urine analysis:    Component Value Date/Time   COLORURINE YELLOW (A) 12/17/2021 1616   APPEARANCEUR CLEAR (A) 12/17/2021 1616   LABSPEC 1.015 12/17/2021 1616   PHURINE 5.0 12/17/2021 1616   GLUCOSEU NEGATIVE 12/17/2021 1616   HGBUR NEGATIVE 12/17/2021 1616   BILIRUBINUR NEGATIVE 12/17/2021 1616   KETONESUR NEGATIVE 12/17/2021 1616   PROTEINUR NEGATIVE 12/17/2021 1616   NITRITE NEGATIVE 12/17/2021 1616   LEUKOCYTESUR NEGATIVE 12/17/2021 1616   Sepsis Labs: Invalid input(s):  "PROCALCITONIN", "LACTICIDVEN"  Microbiology: Recent Results (from the past 240 hour(s))  Resp panel by RT-PCR (RSV, Flu A&B, Covid) Anterior Nasal Swab     Status: None   Collection Time: 09/27/22  2:50 AM   Specimen: Anterior Nasal Swab  Result Value Ref Range Status   SARS  Coronavirus 2 by RT PCR NEGATIVE NEGATIVE Final    Comment: (NOTE) SARS-CoV-2 target nucleic acids are NOT DETECTED.  The SARS-CoV-2 RNA is generally detectable in upper respiratory specimens during the acute phase of infection. The lowest concentration of SARS-CoV-2 viral copies this assay can detect is 138 copies/mL. A negative result does not preclude SARS-Cov-2 infection and should not be used as the sole basis for treatment or other patient management decisions. A negative result may occur with  improper specimen collection/handling, submission of specimen other than nasopharyngeal swab, presence of viral mutation(s) within the areas targeted by this assay, and inadequate number of viral copies(<138 copies/mL). A negative result must be combined with clinical observations, patient history, and epidemiological information. The expected result is Negative.  Fact Sheet for Patients:  EntrepreneurPulse.com.au  Fact Sheet for Healthcare Providers:  IncredibleEmployment.be  This test is no t yet approved or cleared by the [place] FDA and  has been authorized for detection and/or diagnosis of SARS-CoV-2 by FDA under an Emergency Use Authorization (EUA). This EUA will remain  in effect (meaning this test can be used) for the duration of the COVID-19 declaration under Section 564(b)(1) of the Act, 21 U.S.C.section 360bbb-3(b)(1), unless the authorization is terminated  or revoked sooner.       Influenza A by PCR NEGATIVE NEGATIVE Final   Influenza B by PCR NEGATIVE NEGATIVE Final    Comment: (NOTE) The Xpert Xpress SARS-CoV-2/FLU/RSV plus assay is intended as an  aid in the diagnosis of influenza from Nasopharyngeal swab specimens and should not be used as a sole basis for treatment. Nasal washings and aspirates are unacceptable for Xpert Xpress SARS-CoV-2/FLU/RSV testing.  Fact Sheet for Patients: EntrepreneurPulse.com.au  Fact Sheet for Healthcare Providers: IncredibleEmployment.be  This test is not yet approved or cleared by the [place] FDA and has been authorized for detection and/or diagnosis of SARS-CoV-2 by FDA under an Emergency Use Authorization (EUA). This EUA will remain in effect (meaning this test can be used) for the duration of the COVID-19 declaration under Section 564(b)(1) of the Act, 21 U.S.C. section 360bbb-3(b)(1), unless the authorization is terminated or revoked.     Resp Syncytial Virus by PCR NEGATIVE NEGATIVE Final    Comment: (NOTE) Fact Sheet for Patients: EntrepreneurPulse.com.au  Fact Sheet for Healthcare Providers: IncredibleEmployment.be  This test is not yet approved or cleared by the [place] FDA and has been authorized for detection and/or diagnosis of SARS-CoV-2 by FDA under an Emergency Use Authorization (EUA). This EUA will remain in effect (meaning this test can be used) for the duration of the COVID-19 declaration under Section 564(b)(1) of the Act, 21 U.S.C. section 360bbb-3(b)(1), unless the authorization is terminated or revoked.  Performed at [facility], [address] [place], [place] [address]   MRSA Next Gen by PCR, Nasal     Status: None   Collection Time: 09/27/22  6:17 PM   Specimen: Nasal Mucosa; Nasal Swab  Result Value Ref Range Status   MRSA by PCR Next Gen NOT DETECTED NOT DETECTED Final    Comment: (NOTE) The GeneXpert MRSA Assay (FDA approved for NASAL specimens only), is one component of a comprehensive MRSA colonization surveillance program. It is not intended to diagnose MRSA  infection nor to guide or monitor treatment for MRSA infections. Test performance is not FDA approved in patients less than 57 years old. Performed at [facility], [address] [place], [place] [address]     Radiology Studies: CT ANGIO  GI BLEED  Result Date: 09/27/2022 CLINICAL DATA:  Bloody stools, GI bleed EXAM: CTA ABDOMEN AND PELVIS WITHOUT AND WITH CONTRAST TECHNIQUE: Multidetector CT imaging of the abdomen and pelvis was performed using the standard protocol during bolus administration of intravenous contrast. Multiplanar reconstructed images and MIPs were obtained and reviewed to evaluate the vascular anatomy. RADIATION DOSE REDUCTION: This exam was performed according to the departmental dose-optimization program which includes automated exposure control, adjustment of the mA and/or kV according to patient size and/or use of iterative reconstruction technique. CONTRAST:  9m OMNIPAQUE IOHEXOL 350 MG/ML SOLN COMPARISON:  None Available. FINDINGS: Due to a technical issue, the arterial phase imaging stops at the iliac crests and accordingly does not cover the pelvis. As result, pelvic vessels and assessment for pelvic GI bleeding is substantially reduced. I was not comfortable given the patient a second dose of contrast given the underlying renal insufficiency, GFR of 40. VASCULAR Aorta: Mild atheromatous vascular calcifications. Celiac: Atheromatous plaque dorsally along the origin of the celiac artery without flow-limiting stenosis. Atheromatous plaque noted in the splenic artery. SMA: Atheromatous plaque proximally in the SMA without significant stenosis or occlusion. No clot in the visualized branches of the SMA. Renals: Combination of hard and soft plaque medially in both main renal arteries. This may cause mild to moderate stenosis but no occlusion. There is a small accessory artery of the left kidney feeding the upper pole. IMA: Narrowed origin with atheromatous plaque in the  vicinity, but with contrast in a vessel favoring patency. Inflow: Not characterized in arterial phase due to technical issues mentioned above. Portal venous phase characterization demonstrates notable atheromatous vascular calcification in the iliac arteries without substantial narrowing/occlusion. There is some fusiform areas of larger caliber including the right common iliac artery at 2.3 cm and the right internal iliac artery at 1.7 cm. Proximal Outflow: Atheromatous calcification, no occlusion Veins: Unremarkable Review of the MIP images confirms the above findings. NON-VASCULAR Lower chest: Dependent subsegmental atelectasis and scattered bulla. Mild bilateral airway thickening and mild cylindrical bronchiectasis in the lower lobes. Mitral and aortic valve calcifications. Pacer leads noted. Mild to moderate cardiomegaly. Coronary artery atherosclerosis. Hepatobiliary: Unremarkable Pancreas: Unremarkable Spleen: Unremarkable Adrenals/Urinary Tract: Moderate to prominent right hydronephrosis and right hydroureter extending down to indistinct density in the distal ureter, cannot exclude tumor or blood clot. Moderate left hydronephrosis and hydroureter extending down to the UVJ. Irregular prostate gland indents the urinary bladder and appears asymmetric, prostate cancer not excluded. Scattered renal cysts all have benign imaging characteristics. No further imaging workup of these lesions is indicated. Fullness of the adrenal glands without discrete mass. Stomach/Bowel: Extensive material in the stomach favoring recent meal. Sigmoid colon diverticulosis. There a frothy air fluid level in the rectum suggesting diarrheal process. Faint perirectal stranding. As noted above, our our teary ule phase images regrettably only include the upper abdomen but not the pelvis due to technical issues. I do not observe a site of active bleeding into the bowel. Lymphatic: No pathologic adenopathy observed. Reproductive: Irregular  and asymmetric prostate gland. Other: No supplemental non-categorized findings. Musculoskeletal: Subacute fracture of the right anterior sixth rib on image 5 series 10. Bilateral degenerative hip arthropathy. Lumbar spondylosis and degenerative disc disease. IMPRESSION: 1. No site of active bleeding into the bowel is identified. There is some frothy air-fluid level in the rectum suggesting diarrheal process. 2. Bilateral hydronephrosis and hydroureter extending down to the UVJ. There is some indistinct density in the distal right ureter which could be from tumor  or blood clot. 3. Irregular and asymmetric prostate gland indents the urinary bladder and prostate cancer is not excluded. Correlate with PSA levels. 4. Aortoiliac and systemic atherosclerosis without critical stenosis in the major vessels of the abdomen/pelvis identified. 5. Mild to moderate stenosis of both main renal arteries due to mixed and soft plaque. 6. Mild to moderate cardiomegaly. Coronary atherosclerosis. Mitral and aortic valve calcifications. 7. Subacute fracture of the right anterior sixth rib. 8. Bilateral airway thickening and mild cylindrical bronchiectasis in the lower lobes. 9. Sigmoid colon diverticulosis. 10. Lumbar spondylosis and degenerative disc disease. 11. Bilateral renal cysts all have benign imaging characteristics. No further imaging workup of these lesions is indicated. 12. Due to a technical issue, the arterial phase imaging stops at the iliac crests and accordingly does not cover the pelvis. Aortic Atherosclerosis (ICD10-I70.0). Electronically Signed   By: Van Clines M.D.   On: 09/27/2022 18:10      Prescious Hurless T. Rochelle  If 7PM-7AM, please contact night-coverage [email or web address] 09/28/2022, 2:28 PM

## 2022-09-28 NOTE — Interval H&P Note (Signed)
History and Physical Interval Note:  09/28/2022 12:31 PM  Keith Estes  has presented today for surgery, with the diagnosis of Upper GI bleed, hematemesis, acute blood loss anemia.  The various methods of treatment have been discussed with the patient and family. After consideration of risks, benefits and other options for treatment, the patient has consented to  Procedure(s): ESOPHAGOGASTRODUODENOSCOPY (EGD) WITH PROPOFOL (N/A) as a surgical intervention.  The patient's history has been reviewed, patient examined, no change in status, stable for surgery.  I have reviewed the patient's chart and labs.  Questions were answered to the patient's satisfaction.     [place], [place]
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
## 2022-09-28 NOTE — Anesthesia Postprocedure Evaluation (Signed)
Anesthesia Post Note  Patient: Keith Estes  Procedure(s) Performed: ESOPHAGOGASTRODUODENOSCOPY (EGD) WITH PROPOFOL  Patient location during evaluation: PACU Anesthesia Type: General Level of consciousness: sedated and lethargic Pain management: satisfactory to patient Vital Signs Assessment: post-procedure vital signs reviewed and stable Respiratory status: spontaneous breathing and respiratory function stable Cardiovascular status: stable Anesthetic complications: no  No notable events documented.   Last Vitals:  Vitals:   09/28/22 1255 09/28/22 1259  BP: 90/65 97/69  Pulse: 72 74  Resp: 14 14  Temp:    SpO2: 100% 100%    Last Pain:  Vitals:   09/28/22 1259  TempSrc:   PainSc: Asleep                 VAN STAVEREN,Windle Huebert

## 2022-09-29 ENCOUNTER — Encounter
Admission: EM | Disposition: A | Discharge status: Hospice | Payer: Self-pay | Source: Home / Self Care | Attending: Internal Medicine

## 2022-09-29 ENCOUNTER — Encounter: Payer: Self-pay | Admitting: Anesthesiology

## 2022-09-29 DIAGNOSIS — K921 Melena: Secondary | ICD-10-CM | POA: Diagnosis not present

## 2022-09-29 DIAGNOSIS — K922 Gastrointestinal hemorrhage, unspecified: Secondary | ICD-10-CM | POA: Diagnosis not present

## 2022-09-29 LAB — HEMOGLOBIN AND HEMATOCRIT, BLOOD
HCT: 26.2 % — ABNORMAL LOW (ref 39.0–52.0)
HCT: 27.9 % — ABNORMAL LOW (ref 39.0–52.0)
HCT: 26.9 % — ABNORMAL LOW (ref 39.0–52.0)
HCT: 27.7 % — ABNORMAL LOW (ref 39.0–52.0)
Hemoglobin: 8.2 g/dL — ABNORMAL LOW (ref 13.0–17.0)
Hemoglobin: 8.9 g/dL — ABNORMAL LOW (ref 13.0–17.0)
Hemoglobin: 8.8 g/dL — ABNORMAL LOW (ref 13.0–17.0)
Hemoglobin: 8.8 g/dL — ABNORMAL LOW (ref 13.0–17.0)

## 2022-09-29 LAB — COMPREHENSIVE METABOLIC PANEL
ALT: 16 U/L (ref 0–44)
AST: 24 U/L (ref 15–41)
Albumin: 2.9 g/dL — ABNORMAL LOW (ref 3.5–5.0)
Alkaline Phosphatase: 58 U/L (ref 38–126)
Anion gap: 4 — ABNORMAL LOW (ref 5–15)
BUN: 31 mg/dL — ABNORMAL HIGH (ref 8–23)
CO2: 23 mmol/L (ref 22–32)
Calcium: 8.2 mg/dL — ABNORMAL LOW (ref 8.9–10.3)
Chloride: 110 mmol/L (ref 98–111)
Creatinine, Ser: 1.47 mg/dL — ABNORMAL HIGH (ref 0.61–1.24)
GFR, Estimated: 45 mL/min — ABNORMAL LOW (ref >60)
Glucose, Bld: 95 mg/dL (ref 70–99)
Potassium: 4 mmol/L (ref 3.5–5.1)
Sodium: 137 mmol/L (ref 135–145)
Total Bilirubin: 0.9 mg/dL (ref 0.3–1.2)
Total Protein: 5.9 g/dL — ABNORMAL LOW (ref 6.5–8.1)

## 2022-09-29 LAB — CBC
HCT: 26.8 % — ABNORMAL LOW (ref 39.0–52.0)
Hemoglobin: 8.6 g/dL — ABNORMAL LOW (ref 13.0–17.0)
MCH: 29.3 pg (ref 26.0–34.0)
MCHC: 32.1 g/dL (ref 30.0–36.0)
MCV: 91.2 fL (ref 80.0–100.0)
Platelets: 151 10*3/uL (ref 150–400)
RBC: 2.94 MIL/uL — ABNORMAL LOW (ref 4.22–5.81)
RDW: 17.7 % — ABNORMAL HIGH (ref 11.5–15.5)
WBC: 4.9 10*3/uL (ref 4.0–10.5)
nRBC: 0 % (ref 0.0–0.2)

## 2022-09-29 LAB — PHOSPHORUS: Phosphorus: 2.4 mg/dL — ABNORMAL LOW (ref 2.5–4.6)

## 2022-09-29 LAB — MAGNESIUM: Magnesium: 2.2 mg/dL (ref 1.7–2.4)

## 2022-09-29 SURGERY — COLONOSCOPY
Anesthesia: General

## 2022-09-29 MED ORDER — QUETIAPINE FUMARATE 25 MG PO TABS
25.0000 mg | ORAL_TABLET | Freq: Two times a day (BID) | ORAL | Status: DC
  Administered 2022-09-29: 25 mg via ORAL
  Filled 2022-09-29: qty 1

## 2022-09-29 MED ORDER — SODIUM CHLORIDE 0.9 % IV SOLN: INTRAVENOUS | Status: DC

## 2022-09-29 MED ORDER — MAGNESIUM CITRATE PO SOLN: 1.0000 | Freq: Once | ORAL | Status: DC

## 2022-09-29 MED ORDER — LORAZEPAM 2 MG/ML IJ SOLN
1.0000 mg | INTRAMUSCULAR | Status: DC | PRN
  Administered 2022-09-29: 1 mg via INTRAVENOUS
  Filled 2022-09-29: qty 1

## 2022-09-29 MED ORDER — HALOPERIDOL LACTATE 5 MG/ML IJ SOLN
1.0000 mg | Freq: Four times a day (QID) | INTRAMUSCULAR | Status: DC | PRN
  Administered 2022-09-29 (×2): 1 mg via INTRAVENOUS
  Filled 2022-09-29 (×2): qty 1

## 2022-09-29 MED ORDER — HALOPERIDOL LACTATE 5 MG/ML IJ SOLN: 2.0000 mg | Freq: Four times a day (QID) | INTRAMUSCULAR | Status: DC | PRN

## 2022-09-29 MED ORDER — QUETIAPINE FUMARATE 25 MG PO TABS: 25.0000 mg | ORAL_TABLET | Freq: Two times a day (BID) | ORAL | Status: DC
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
## 2022-09-29 NOTE — Progress Notes (Signed)
PT Cancellation Note  Patient Details Name: CHUE BUDNER MRN: [id number] DOB: Jan 10, 1929   Cancelled Treatment:    Reason Eval/Treat Not Completed: Other (comment): Spoke to nursing who requested PT hold this date secondary to pt's level of agitation making him inappropriate to participate with PT services at this time. Will attempt to see pt at a future date/time as medically appropriate.    Linus Salmons PT, DPT 09/29/22, 2:11 PM

## 2022-09-29 NOTE — Progress Notes (Signed)
Pt attempting to get out bed, attempting to pull iv lines and pulse ox. Pt easily redirected but immediately attempts to remove medical equipment and get out of bed.  Md notified and pt medicated. Order placed for tele sitter.

## 2022-09-29 NOTE — Progress Notes (Signed)
Family exited pt room requesting assistance. Upon entering room pt had a large liquid bm and had both hands submerged in stool and it was smeared all over bed.  Flexiseal placed and feces draining.

## 2022-09-29 NOTE — Progress Notes (Signed)
Cephas Darby, MD [address]  [place]  [place], [place] [address]  Main: [phone]  Fax: [phone] Pager: [phone]   Subjective: Patient did not complete even one third of his bowel prep.  He had 3 very dark medium size bowel movements today.  According to patient's nurse and his daughter, the amount of bleeding has improved compared to last 2 days.  Patient is confused, pulling out all his lines.  His colonoscopy has been canceled for today   Objective: Vital signs in last 24 hours: Vitals:   09/29/22 0900 09/29/22 1000 09/29/22 1100 09/29/22 1200  BP:  98/78  135/89  Pulse: 70 71 72 72  Resp: 15 15 20 15  $ Temp:      TempSrc:      SpO2: 100% 99% 98% 100%  Weight:      Height:       Weight change:   Intake/Output Summary (Last 24 hours) at 09/29/2022 1339 Last data filed at 09/29/2022 1100 Gross per 24 hour  Intake 2135.38 ml  Output 2000 ml  Net 135.38 ml     Exam: Heart:: Regular rate and rhythm, S1S2 present, or without murmur or extra heart sounds Lungs: normal and clear to auscultation Abdomen: soft, nontender, normal bowel sounds   Lab Results:    Latest Ref Rng & Units 09/29/2022    7:55 AM 09/29/2022    2:30 AM 09/28/2022    8:38 PM  CBC  WBC 4.0 - 10.5 K/uL  4.9    Hemoglobin 13.0 - 17.0 g/dL 8.2  8.6  8.0   Hematocrit 39.0 - 52.0 % 26.2  26.8  25.6   Platelets 150 - 400 K/uL  151        Latest Ref Rng & Units 09/29/2022    2:30 AM 09/28/2022    4:48 AM 09/27/2022    4:41 AM  CMP  Glucose 70 - 99 mg/dL 95  97  100   BUN 8 - 23 mg/dL 31  43  34   Creatinine 0.61 - 1.24 mg/dL 1.47  1.43  1.62   Sodium 135 - 145 mmol/L 137  141  140   Potassium 3.5 - 5.1 mmol/L 4.0  4.3  3.8   Chloride 98 - 111 mmol/L 110  117  111   CO2 22 - 32 mmol/L 23  18  21   $ Calcium 8.9 - 10.3 mg/dL 8.2  7.8  8.1   Total Protein 6.5 - 8.1 g/dL 5.9     Total Bilirubin 0.3 - 1.2 mg/dL 0.9     Alkaline Phos 38 - 126 U/L 58     AST 15 - 41 U/L 24     ALT  0 - 44 U/L 16       Micro Results: Recent Results (from the past 240 hour(s))  Resp panel by RT-PCR (RSV, Flu A&B, Covid) Anterior Nasal Swab     Status: None   Collection Time: 09/27/22  2:50 AM   Specimen: Anterior Nasal Swab  Result Value Ref Range Status   SARS Coronavirus 2 by RT PCR NEGATIVE NEGATIVE Final    Comment: (NOTE) SARS-CoV-2 target nucleic acids are NOT DETECTED.  The SARS-CoV-2 RNA is generally detectable in upper respiratory specimens during the acute phase of infection. The lowest concentration of SARS-CoV-2 viral copies this assay can detect is 138 copies/mL. A negative result does not preclude SARS-Cov-2 infection and should not be used as the sole basis for treatment  or other patient management decisions. A negative result may occur with  improper specimen collection/handling, submission of specimen other than nasopharyngeal swab, presence of viral mutation(s) within the areas targeted by this assay, and inadequate number of viral copies(<138 copies/mL). A negative result must be combined with clinical observations, patient history, and epidemiological information. The expected result is Negative.  Fact Sheet for Patients:  EntrepreneurPulse.com.au  Fact Sheet for Healthcare Providers:  IncredibleEmployment.be  This test is no t yet approved or cleared by the [place] FDA and  has been authorized for detection and/or diagnosis of SARS-CoV-2 by FDA under an Emergency Use Authorization (EUA). This EUA will remain  in effect (meaning this test can be used) for the duration of the COVID-19 declaration under Section 564(b)(1) of the Act, 21 U.S.C.section 360bbb-3(b)(1), unless the authorization is terminated  or revoked sooner.       Influenza A by PCR NEGATIVE NEGATIVE Final   Influenza B by PCR NEGATIVE NEGATIVE Final    Comment: (NOTE) The Xpert Xpress SARS-CoV-2/FLU/RSV plus assay is intended as an aid in the  diagnosis of influenza from Nasopharyngeal swab specimens and should not be used as a sole basis for treatment. Nasal washings and aspirates are unacceptable for Xpert Xpress SARS-CoV-2/FLU/RSV testing.  Fact Sheet for Patients: EntrepreneurPulse.com.au  Fact Sheet for Healthcare Providers: IncredibleEmployment.be  This test is not yet approved or cleared by the [place] FDA and has been authorized for detection and/or diagnosis of SARS-CoV-2 by FDA under an Emergency Use Authorization (EUA). This EUA will remain in effect (meaning this test can be used) for the duration of the COVID-19 declaration under Section 564(b)(1) of the Act, 21 U.S.C. section 360bbb-3(b)(1), unless the authorization is terminated or revoked.     Resp Syncytial Virus by PCR NEGATIVE NEGATIVE Final    Comment: (NOTE) Fact Sheet for Patients: EntrepreneurPulse.com.au  Fact Sheet for Healthcare Providers: IncredibleEmployment.be  This test is not yet approved or cleared by the [place] FDA and has been authorized for detection and/or diagnosis of SARS-CoV-2 by FDA under an Emergency Use Authorization (EUA). This EUA will remain in effect (meaning this test can be used) for the duration of the COVID-19 declaration under Section 564(b)(1) of the Act, 21 U.S.C. section 360bbb-3(b)(1), unless the authorization is terminated or revoked.  Performed at [facility], [address] [place], [place] [address]   MRSA Next Gen by PCR, Nasal     Status: None   Collection Time: 09/27/22  6:17 PM   Specimen: Nasal Mucosa; Nasal Swab  Result Value Ref Range Status   MRSA by PCR Next Gen NOT DETECTED NOT DETECTED Final    Comment: (NOTE) The GeneXpert MRSA Assay (FDA approved for NASAL specimens only), is one component of a comprehensive MRSA colonization surveillance program. It is not intended to diagnose MRSA infection  nor to guide or monitor treatment for MRSA infections. Test performance is not FDA approved in patients less than 43 years old. Performed at [facility], [address] [place], [place] [address]    Studies/Results: CT ANGIO GI BLEED  Result Date: 09/27/2022 CLINICAL DATA:  Bloody stools, GI bleed EXAM: CTA ABDOMEN AND PELVIS WITHOUT AND WITH CONTRAST TECHNIQUE: Multidetector CT imaging of the abdomen and pelvis was performed using the standard protocol during bolus administration of intravenous contrast. Multiplanar reconstructed images and MIPs were obtained and reviewed to evaluate the vascular anatomy. RADIATION DOSE REDUCTION: This exam was performed according to the departmental dose-optimization program which includes  automated exposure control, adjustment of the mA and/or kV according to patient size and/or use of iterative reconstruction technique. CONTRAST:  37m OMNIPAQUE IOHEXOL 350 MG/ML SOLN COMPARISON:  None Available. FINDINGS: Due to a technical issue, the arterial phase imaging stops at the iliac crests and accordingly does not cover the pelvis. As result, pelvic vessels and assessment for pelvic GI bleeding is substantially reduced. I was not comfortable given the patient a second dose of contrast given the underlying renal insufficiency, GFR of 40. VASCULAR Aorta: Mild atheromatous vascular calcifications. Celiac: Atheromatous plaque dorsally along the origin of the celiac artery without flow-limiting stenosis. Atheromatous plaque noted in the splenic artery. SMA: Atheromatous plaque proximally in the SMA without significant stenosis or occlusion. No clot in the visualized branches of the SMA. Renals: Combination of hard and soft plaque medially in both main renal arteries. This may cause mild to moderate stenosis but no occlusion. There is a small accessory artery of the left kidney feeding the upper pole. IMA: Narrowed origin with atheromatous plaque in the vicinity, but  with contrast in a vessel favoring patency. Inflow: Not characterized in arterial phase due to technical issues mentioned above. Portal venous phase characterization demonstrates notable atheromatous vascular calcification in the iliac arteries without substantial narrowing/occlusion. There is some fusiform areas of larger caliber including the right common iliac artery at 2.3 cm and the right internal iliac artery at 1.7 cm. Proximal Outflow: Atheromatous calcification, no occlusion Veins: Unremarkable Review of the MIP images confirms the above findings. NON-VASCULAR Lower chest: Dependent subsegmental atelectasis and scattered bulla. Mild bilateral airway thickening and mild cylindrical bronchiectasis in the lower lobes. Mitral and aortic valve calcifications. Pacer leads noted. Mild to moderate cardiomegaly. Coronary artery atherosclerosis. Hepatobiliary: Unremarkable Pancreas: Unremarkable Spleen: Unremarkable Adrenals/Urinary Tract: Moderate to prominent right hydronephrosis and right hydroureter extending down to indistinct density in the distal ureter, cannot exclude tumor or blood clot. Moderate left hydronephrosis and hydroureter extending down to the UVJ. Irregular prostate gland indents the urinary bladder and appears asymmetric, prostate cancer not excluded. Scattered renal cysts all have benign imaging characteristics. No further imaging workup of these lesions is indicated. Fullness of the adrenal glands without discrete mass. Stomach/Bowel: Extensive material in the stomach favoring recent meal. Sigmoid colon diverticulosis. There a frothy air fluid level in the rectum suggesting diarrheal process. Faint perirectal stranding. As noted above, our our teary ule phase images regrettably only include the upper abdomen but not the pelvis due to technical issues. I do not observe a site of active bleeding into the bowel. Lymphatic: No pathologic adenopathy observed. Reproductive: Irregular and asymmetric  prostate gland. Other: No supplemental non-categorized findings. Musculoskeletal: Subacute fracture of the right anterior sixth rib on image 5 series 10. Bilateral degenerative hip arthropathy. Lumbar spondylosis and degenerative disc disease. IMPRESSION: 1. No site of active bleeding into the bowel is identified. There is some frothy air-fluid level in the rectum suggesting diarrheal process. 2. Bilateral hydronephrosis and hydroureter extending down to the UVJ. There is some indistinct density in the distal right ureter which could be from tumor or blood clot. 3. Irregular and asymmetric prostate gland indents the urinary bladder and prostate cancer is not excluded. Correlate with PSA levels. 4. Aortoiliac and systemic atherosclerosis without critical stenosis in the major vessels of the abdomen/pelvis identified. 5. Mild to moderate stenosis of both main renal arteries due to mixed and soft plaque. 6. Mild to moderate cardiomegaly. Coronary atherosclerosis. Mitral and aortic valve calcifications. 7. Subacute fracture of the  right anterior sixth rib. 8. Bilateral airway thickening and mild cylindrical bronchiectasis in the lower lobes. 9. Sigmoid colon diverticulosis. 10. Lumbar spondylosis and degenerative disc disease. 11. Bilateral renal cysts all have benign imaging characteristics. No further imaging workup of these lesions is indicated. 12. Due to a technical issue, the arterial phase imaging stops at the iliac crests and accordingly does not cover the pelvis. Aortic Atherosclerosis (ICD10-I70.0). Electronically Signed   By: Van Clines M.D.   On: 09/27/2022 18:10   Medications: I have reviewed the patient's current medications. Prior to Admission:  Medications Prior to Admission  Medication Sig Dispense Refill Last Dose   albuterol (PROVENTIL) (2.5 MG/3ML) 0.083% nebulizer solution Take 2.5 mg by nebulization every 6 (six) hours as needed for wheezing or shortness of breath.   unknown    apixaban (ELIQUIS) 5 MG TABS tablet Take by mouth.   09/26/2022 at 0900   Bioflavonoid Products (BIOFLEX PO) Take 1 tablet by mouth 2 (two) times daily.   09/26/2022   carvedilol (COREG) 3.125 MG tablet Take 3.125 mg by mouth 2 (two) times daily with a meal.   09/26/2022   Cholecalciferol (VITAMIN D-3) 1000 units CAPS Take 1 capsule by mouth daily.   09/26/2022   donepezil (ARICEPT) 10 MG tablet Take 10 mg by mouth at bedtime.   09/26/2022   famotidine (PEPCID) 40 MG tablet Take 40 mg by mouth at bedtime.   09/26/2022   fexofenadine (ALLEGRA) 180 MG tablet Take 180 mg by mouth daily.   09/26/2022   fluticasone (FLONASE) 50 MCG/ACT nasal spray Place 1 spray into both nostrils 2 (two) times daily as needed for allergies or rhinitis.    unknown   isosorbide mononitrate (IMDUR) 30 MG 24 hr tablet Take 30 mg by mouth daily.   09/26/2022   Misc Natural Products (URINOZINC PO) Take 1 tablet by mouth 2 (two) times daily.    09/26/2022   Multiple Vitamins-Minerals (PRESERVISION AREDS 2 PO) Take 1 tablet by mouth 2 (two) times daily.   09/26/2022   nitroGLYCERIN (NITROSTAT) 0.4 MG SL tablet Place 1 tablet (0.4 mg total) under the tongue every 5 (five) minutes as needed for chest pain. 30 tablet 1 unknown   omeprazole (PRILOSEC) 40 MG capsule Take 40 mg by mouth daily.   09/26/2022   Potassium Chloride CR (MICRO-K) 8 MEQ CPCR capsule CR TAKE 1 CAPSULE(8 MEQ) BY MOUTH EVERY DAY   09/26/2022   tamsulosin (FLOMAX) 0.4 MG CAPS capsule Take 0.4 mg by mouth daily after supper.    09/26/2022   torsemide (DEMADEX) 10 MG tablet TAKE 1 TABLET(10 MG) BY MOUTH EVERY DAY   09/26/2022   traZODone (DESYREL) 50 MG tablet Take 50 mg by mouth at bedtime.   09/26/2022   UNABLE TO FIND Tumeric 1 tablet daily   09/26/2022   B Complex-C (B-COMPLEX WITH VITAMIN C) tablet Take 1 tablet by mouth daily. (Patient not taking: Reported on 09/27/2022)   Not Taking   fluticasone furoate-vilanterol (BREO ELLIPTA) 200-25 MCG/INH AEPB Inhale 1 puff into the lungs daily.  Rinse mouth after use. (Patient not taking: Reported on 09/27/2022) 1 each 0 Not Taking   Scheduled:  sodium chloride   Intravenous Once   B-complex with vitamin C  1 tablet Oral Daily   bisacodyl  10 mg Oral Once   bisacodyl  10 mg Rectal Once   carvedilol  3.125 mg Oral BID WC   Chlorhexidine Gluconate Cloth  6 each Topical Daily   cholecalciferol  1,000 Units Oral Daily   [START ON 09/30/2022] vitamin B-12  1,000 mcg Oral Daily   donepezil  10 mg Oral QHS   feeding supplement  1 Container Oral TID BM   fluticasone furoate-vilanterol  1 puff Inhalation Daily   isosorbide mononitrate  30 mg Oral Daily   loratadine  10 mg Oral Daily   multivitamin with minerals  1 tablet Oral Daily   potassium chloride  10 mEq Oral Daily   tamsulosin  0.4 mg Oral QPC supper   traZODone  50 mg Oral QHS   Continuous:  sodium chloride Stopped (09/28/22 1451)   sodium chloride     lactated ringers 100 mL/hr at 09/29/22 0500   pantoprazole 8 mg/hr (09/29/22 0808)   [id number] **OR** acetaminophen, fluticasone, haloperidol lactate, levalbuterol, magnesium hydroxide, nitroGLYCERIN, ondansetron **OR** ondansetron (ZOFRAN) IV, traZODone Anti-infectives (From admission, onward)    None      Scheduled Meds:  sodium chloride   Intravenous Once   B-complex with vitamin C  1 tablet Oral Daily   bisacodyl  10 mg Oral Once   bisacodyl  10 mg Rectal Once   carvedilol  3.125 mg Oral BID WC   Chlorhexidine Gluconate Cloth  6 each Topical Daily   cholecalciferol  1,000 Units Oral Daily   [START ON 09/30/2022] vitamin B-12  1,000 mcg Oral Daily   donepezil  10 mg Oral QHS   feeding supplement  1 Container Oral TID BM   fluticasone furoate-vilanterol  1 puff Inhalation Daily   isosorbide mononitrate  30 mg Oral Daily   loratadine  10 mg Oral Daily   multivitamin with minerals  1 tablet Oral Daily   potassium chloride  10 mEq Oral Daily   tamsulosin  0.4 mg Oral QPC supper   traZODone  50 mg Oral QHS    Continuous Infusions:  sodium chloride Stopped (09/28/22 1451)   lactated ringers 100 mL/hr at 09/29/22 0500   pantoprazole 8 mg/hr (09/29/22 0808)   PRN Meds:.acetaminophen **OR** acetaminophen, fluticasone, haloperidol lactate, levalbuterol, magnesium hydroxide, nitroGLYCERIN, ondansetron **OR** ondansetron (ZOFRAN) IV, traZODone   Assessment: Principal Problem:   GI bleed Active Problems:   Chronic obstructive pulmonary disease (COPD) (HCC)   Chronic a-fib (HCC)   Essential hypertension   Coronary artery disease   BPH (benign prostatic hyperplasia)   Dementia with behavioral disturbance (HCC)   AKI (acute kidney injury) ([place])   Acute blood loss anemia   Pressure injury of skin   Diarrhea   Elevated troponin   Bilateral hydronephrosis   Renal artery stenosis, native, bilateral ([place])  89 year old male with history of coronary artery disease, hypertension, A-fib on chronic anticoagulation with Eliquis who presented with hematochezia as well as hematemesis.  EGD yesterday revealed nonbleeding Mallory-Weiss tear.  Patient did not have any further episodes of hematemesis.  Plan: Hematochezia Hemoglobin has been overall stable, patient is hemodynamically stable Will attempt bowel prep today for colonoscopy/flexible sigmoidoscopy tomorrow Continue clear liquid diet Continue IV iron Monitor CBC closely to maintain hemoglobin above 8 Eliquis has been held since admission Discussed my recommendations with patient's daughter, Judeen Hammans over the phone who is agreeable with the plan   LOS: 2 days   Jos Cygan 09/29/2022, 1:39 PM

## 2022-09-29 NOTE — Progress Notes (Signed)
Attempting to give pt NuLYTELY 4,000 mL solution in prep for colonoscopy this morning. Pt is reluctant to take more than 1-2 few sips at a time, declining RN efforts to drink more after prompting. This RN will continue to provide the solution throughout the shift.   Cameron Ali, RN

## 2022-09-30 ENCOUNTER — Encounter
Admission: EM | Disposition: A | Discharge status: Hospice | Payer: Self-pay | Source: Home / Self Care | Attending: Internal Medicine

## 2022-09-30 DIAGNOSIS — K922 Gastrointestinal hemorrhage, unspecified: Secondary | ICD-10-CM | POA: Diagnosis not present

## 2022-09-30 LAB — CBC WITH DIFFERENTIAL/PLATELET
Abs Immature Granulocytes: 0.04 10*3/uL (ref 0.00–0.07)
Basophils Absolute: 0 10*3/uL (ref 0.0–0.1)
Basophils Relative: 0 %
Eosinophils Absolute: 0.3 10*3/uL (ref 0.0–0.5)
Eosinophils Relative: 5 %
HCT: 26.3 % — ABNORMAL LOW (ref 39.0–52.0)
Hemoglobin: 8.4 g/dL — ABNORMAL LOW (ref 13.0–17.0)
Immature Granulocytes: 1 %
Lymphocytes Relative: 19 %
Lymphs Abs: 1.1 10*3/uL (ref 0.7–4.0)
MCH: 29.4 pg (ref 26.0–34.0)
MCHC: 31.9 g/dL (ref 30.0–36.0)
MCV: 92 fL (ref 80.0–100.0)
Monocytes Absolute: 0.6 10*3/uL (ref 0.1–1.0)
Monocytes Relative: 9 %
Neutro Abs: 3.9 10*3/uL (ref 1.7–7.7)
Neutrophils Relative %: 66 %
Platelets: 154 10*3/uL (ref 150–400)
RBC: 2.86 MIL/uL — ABNORMAL LOW (ref 4.22–5.81)
RDW: 17.6 % — ABNORMAL HIGH (ref 11.5–15.5)
WBC: 5.9 10*3/uL (ref 4.0–10.5)
nRBC: 0 % (ref 0.0–0.2)

## 2022-09-30 LAB — BASIC METABOLIC PANEL
Anion gap: 6 (ref 5–15)
BUN: 19 mg/dL (ref 8–23)
CO2: 20 mmol/L — ABNORMAL LOW (ref 22–32)
Calcium: 8.4 mg/dL — ABNORMAL LOW (ref 8.9–10.3)
Chloride: 114 mmol/L — ABNORMAL HIGH (ref 98–111)
Creatinine, Ser: 1.43 mg/dL — ABNORMAL HIGH (ref 0.61–1.24)
GFR, Estimated: 46 mL/min — ABNORMAL LOW (ref >60)
Glucose, Bld: 89 mg/dL (ref 70–99)
Potassium: 4 mmol/L (ref 3.5–5.1)
Sodium: 140 mmol/L (ref 135–145)

## 2022-09-30 LAB — HEMOGLOBIN AND HEMATOCRIT, BLOOD
HCT: 25.5 % — ABNORMAL LOW (ref 39.0–52.0)
HCT: 26.4 % — ABNORMAL LOW (ref 39.0–52.0)
Hemoglobin: 8.3 g/dL — ABNORMAL LOW (ref 13.0–17.0)
Hemoglobin: 8.4 g/dL — ABNORMAL LOW (ref 13.0–17.0)

## 2022-09-30 LAB — GLUCOSE, CAPILLARY: Glucose-Capillary: 90 mg/dL (ref 70–99)

## 2022-09-30 SURGERY — COLONOSCOPY WITH PROPOFOL
Anesthesia: General

## 2022-09-30 MED ORDER — PANTOPRAZOLE SODIUM 40 MG PO TBEC
40.0000 mg | DELAYED_RELEASE_TABLET | Freq: Two times a day (BID) | ORAL | Status: DC
  Administered 2022-09-30 – 2022-10-03 (×6): 40 mg via ORAL
  Filled 2022-09-30 (×6): qty 1

## 2022-09-30 MED ORDER — QUETIAPINE FUMARATE 25 MG PO TABS: 25.0000 mg | ORAL_TABLET | Freq: Two times a day (BID) | ORAL | Status: DC | PRN
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
## 2022-09-30 NOTE — Evaluation (Signed)
Physical Therapy Evaluation Patient Details Name: Keith Estes MRN: [id number] DOB: Feb 04, 1929 Today's Date: 09/30/2022  History of Present Illness  Patient is a 89 year old with history of coronary artery disease, hypertension, A-fib on chronic anticoagulation with Eliquis who presented with hematochezia as well as hematemesis. History of dementia.  Clinical Impression  Patient in bed on arrival to room, no family present. Per notes, patient has history of dementia and lives at home. The patient is confused today but is able to follow some single step commands with increased time. He is restless and moving spontaneously in the bed, has eyes closed most of the time. He is pulling at the external catheter at times, but can be redirected and was provided with the posey activity apron.  The patient required assistance for rolling in bed as well as sitting up on the edge of bed. He participated with mobility but has his eyes closed except for brief period while sitting upright. Unable to safely progress mobility further due to cognition, level of alertness, confusion. Anticipate patient will require some physical assistance with mobility at discharge. Consider discharge home with HHPT and family support versus SNF. PT will continue to follow to maximize independence and decrease caregiver burden.      Recommendations for follow up therapy are one component of a multi-disciplinary discharge planning process, led by the attending physician.  Recommendations may be updated based on patient status, additional functional criteria and insurance authorization.  Follow Up Recommendations  (home with HHPT/family support vs SNF)      Assistance Recommended at Discharge Frequent or constant Supervision/Assistance  Patient can return home with the following  A lot of help with walking and/or transfers;A lot of help with bathing/dressing/bathroom    Equipment Recommendations  (ongoing assessment)   Recommendations for Other Services       Functional Status Assessment Patient has had a recent decline in their functional status and demonstrates the ability to make significant improvements in function in a reasonable and predictable amount of time.     Precautions / Restrictions Precautions Precautions: Fall Restrictions Weight Bearing Restrictions: No      Mobility  Bed Mobility Overal bed mobility: Needs Assistance Bed Mobility: Supine to Sit, Sit to Supine, Rolling Rolling: Mod assist   Supine to sit: Mod assist Sit to supine: Max assist   General bed mobility comments: patient does initiate rolling to the right to command and participates with sitting upright on edge of bed. eyes opened briefly with sitting upright, otherwise has eyes closed. he is restless most of the time    Transfers                   General transfer comment: not attempted due to safety concerns with level of alertness and confusion    Ambulation/Gait                  Stairs            Wheelchair Mobility    Modified Rankin (Stroke Patients Only)       Balance Overall balance assessment: Needs assistance Sitting-balance support: No upper extremity supported, Feet supported Sitting balance-Leahy Scale: Poor Sitting balance - Comments: external support required to maintain sitting balance with posterior lean. total sitting time less than one minute, patient has eyes opened only briefly Postural control: Posterior lean  Pertinent Vitals/Pain Pain Assessment Pain Assessment: Faces Faces Pain Scale: No hurt    Home Living Family/patient expects to be discharged to:: Private residence Living Arrangements: Children                 Additional Comments: Patient is a poor historian and unable to provide information. He is confused and reports he lives with his mother. Chart indicates patient is coming form home  where he lives with his family.    Prior Function Prior Level of Function : Needs assist  Cognitive Assist : ADLs (cognitive);Mobility (cognitive)           Mobility Comments: patient is a poor historian with no family present in room. Per previous admissions, patient ambulatory with 4 wheeled walker in the home ADLs Comments: assistance provided per previous hospital stay. patient is unable to state this date     Hand Dominance        Extremity/Trunk Assessment   Upper Extremity Assessment Upper Extremity Assessment: Generalized weakness    Lower Extremity Assessment Lower Extremity Assessment: Generalized weakness       Communication   Communication: HOH  Cognition Arousal/Alertness: Lethargic Behavior During Therapy: Restless Overall Cognitive Status: History of cognitive impairments - at baseline                                 General Comments: No family present to determined baseline level of cognitive function. Patient unable to tell me his name. He is able to follow some single step commands with increased time/repetition. He is restless and groggy throughout, only opening eyes briefly with mobility        General Comments      Exercises     Assessment/Plan    PT Assessment Patient needs continued PT services  PT Problem List Decreased strength;Decreased range of motion;Decreased activity tolerance;Decreased balance;Decreased mobility;Decreased cognition;Decreased safety awareness       PT Treatment Interventions DME instruction;Gait training;Stair training;Functional mobility training;Therapeutic activities;Therapeutic exercise;Balance training;Neuromuscular re-education;Cognitive remediation;Patient/family education;Wheelchair mobility training    PT Goals (Current goals can be found in the Care Plan section)  Acute Rehab PT Goals Patient Stated Goal: patient unable to participate in goal setting PT Goal Formulation: Patient unable to  participate in goal setting Time For Goal Achievement: 10/14/22 Potential to Achieve Goals: Poor    Frequency Min 2X/week     Co-evaluation               AM-PAC PT "6 Clicks" Mobility  Outcome Measure Help needed turning from your back to your side while in a flat bed without using bedrails?: A Lot Help needed moving from lying on your back to sitting on the side of a flat bed without using bedrails?: A Lot Help needed moving to and from a bed to a chair (including a wheelchair)?: A Lot Help needed standing up from a chair using your arms (e.g., wheelchair or bedside chair)?: A Lot Help needed to walk in hospital room?: A Lot Help needed climbing 3-5 steps with a railing? : A Lot 6 Click Score: 12    End of Session   Activity Tolerance: Patient limited by fatigue;Patient limited by lethargy (limited by confusion) Patient left: in bed;with call bell/phone within reach;with bed alarm set;with SCD's reapplied Nurse Communication: Mobility status PT Visit Diagnosis: Muscle weakness (generalized) (M62.81);Other abnormalities of gait and mobility (R26.89)    Time: [id number] PT Time Calculation (min) (ACUTE  ONLY): 13 min   Charges:   PT Evaluation $PT Eval Low Complexity: [place], PT, MPT   Percell Locus 09/30/2022, 10:47 AM

## 2022-09-30 NOTE — Progress Notes (Signed)
Patient was agitated all day yesterday.  He has slept through the night and it did not drink any prep.  He is having brown bowel movements.  Patient's daughter was in the room.  I had a detailed discussion with patient's daughter over phone.  According to her, patient had large volume hematemesis with clots prior to his admission which is most likely the source of bleeding, evidenced by Mallory-Weiss tear on recent upper endoscopy.  His hemoglobin has been stable within last 72 hours.  Therefore, colonoscopy has been canceled.  Family is likely seeking hospice services.  Eliquis will not be restarted as per Dr.Dahal Continue Protonix or Prilosec 40 mg once a day at least for 3 months Resume diet  GI will sign off at this time, please call us back with questions or concerns   Sherri Sear, MD

## 2022-09-30 NOTE — Progress Notes (Signed)
PROGRESS NOTE  Keith Estes  DOB: 1929-01-28  PCP: Sallee Lange, NP [id number]  DOA: 09/27/2022  LOS: 3 days  Hospital Day: 4  Brief narrative: Keith Estes is a 89 y.o. male with PMH significant for dementia, COPD, CAD, OSA on CPAP, paroxysmal A-fib on Eliquis, BPH, GERD. 2/8, patient presented to the ED with complaint of acute hematemesis and diarrhea for 2 days  In the ED, patient was hemodynamically stable Labs with hemoglobin 9.9, creatinine 1.75 Patient was started on IV Protonix, IV fluid.  Eliquis was held Admitted to [place] consulted In the next 24 hours, patient had multiple bloody bowel movements with blood clots.  Hgb dropped to 7.7.   CT angio did not show any extravasation but noted to have an incidental finding of moderate bilateral hydronephrosis and some irregularity and asymmetry of prostate gland, mild to moderate stenosis of both main renal arteries.   Patient was transferred to stepdown unit and transfused 2 units. 2/9, patient underwent EGD that showed Mallory Weiss tear.    Subjective: Patient was seen and examined this morning.   Sleeping.  Daughter at bedside.   Patient had a rough day yesterday with lot of agitation difficult to control.   Around 8 PM, he got Seroquel 25 mg , trazodone 50 mg, 1 mg IV Ativan and after which he slept all night. Continues to have BM with dark streaks. Hemoglobin stable and blood work.  Assessment and plan: Acute GI bleeding  Initially presented with hematemesis and in the first 24 hours had multiple bloody bowel movements EGD 2/9 showed Mallory-Weiss tear.  There was a tentative plan of colonoscopy today but patient could not complete the preparation.  GI discussed with patient's daughter.  Plan to hold off on colonoscopy at this time.  Acute blood loss anemia  Chronic anemia due to iron deficiency and vitamin B12 deficiency Hemoglobin downtrending in the last 1 year but was over 10 at baseline.   Presented with a low hemoglobin of 7.7 hemoglobin trend as below.  So far received 2 units of PRBCs. Given IM vitamin B12.  Started on oral Given IV iron.  Start oral supplement prior to discharge. Recent Labs    09/27/22 1648 09/27/22 1653 09/28/22 0448 09/29/22 0230 09/29/22 0755 09/29/22 1735 09/29/22 2020 09/30/22 0143 09/30/22 0619 09/30/22 0820  HGB  --  7.7*   < > 8.6*   < > 8.8* 8.8* 8.3* 8.4* 8.4*  MCV  --  92.7   < > 91.2  --   --   --   --  92.0  --   VITAMINB12 132*  --   --   --   --   --   --   --   --   --   FOLATE  --  13.7  --   --   --   --   --   --   --   --   FERRITIN  --  16*  --   --   --   --   --   --   --   --   TIBC  --  304  --   --   --   --   --   --   --   --   IRON  --  37*  --   --   --   --   --   --   --   --   RETICCTPCT  1.8  --   --   --   --   --   --   --   --   --    < > = values in this interval not displayed.    Paroxysmal atrial fibrillation Rate controlled on Coreg.  Junctional rhythm noted on last EKG done on admission. Eliquis on hold.  Discussed with his daughter this morning.  I would recommend to not resume Eliquis considering his age, GI bleeding and hospitalization with complications.  She agrees with the plan.  Acute metabolic encephalopathy  Dementia with behavioral disturbance Acute delirium secondary to acute illness, hospitalization, loss of circadian rhythm.   Significant agitated all yesterday.  Slept good with combination medications last night. Gradually waking up this morning.  Continue to monitor mental status One-to-one sitter ordered.   AKI Creatinine normal at baseline.  Presented with creatinine elevated 1.75. AKI likely due to combination of diarrhea as well as obstructive uropathy Currently on LR at 100 mill per hour.  Once he wakes up and mental status improves, we can stop fluid. Diuretics on hold Continue monitor creatinine Recent Labs    12/17/21 1444 12/18/21 0537 09/27/22 0250 09/27/22 0441  09/28/22 0448 09/29/22 0230 09/30/22 0619  BUN 22 18 33* 34* 43* 31* 19  CREATININE 1.11 0.87 1.75* 1.62* 1.43* 1.47* 1.43*    Bilateral hydronephrosis BPH PTA on Flomax 0.4 mg daily  CT angio 2/8 showed an incidental finding of bilateral hydronephrosis Urology consulted. Family likes to defer Foley catheter. Monitor urine output and renal function Continue Flomax Outpatient follow-up with urology   Bilateral renal artery stenosis:  CT angio showed mild to moderate bilateral renal stenosis.  Incidental finding. Outpatient followup with vascular surgery    Elevated troponin Coronary artery disease Elevated troponin likely demand ischemia and delayed clearance.  No significant delta to suggest ACS. Continue Imdur, Coreg and statin.    Essential hypertension Cardiac meds as above.   Chronic obstructive pulmonary disease (COPD) Change albuterol to Xopenex as needed.   Obesity  Body mass index is 33.12 kg/m. Patient has been advised to make an attempt to improve diet and exercise patterns to aid in weight loss.  Mobility: PT eval ordered  Goals of care   Code Status: DNR.  His daughter this morning expressed wish to start home hospice services at discharge.  Consult placed.    DVT prophylaxis:  SCDs Start: 09/27/22 0410   Antimicrobials: None Fluid: LR at 100 mill per hour Consultants: GI Family Communication: Daughter at bedside  Status is: Inpatient Level of care: Telemetry Medical   Dispo: Patient is from: Home              Anticipated d/c is to: Pending clinical course Continue inhospital care because: If his mental status improves and stabilizes for next 24 hours, he may be medically stable for discharge tomorrow.  Pending PT eval however.   Scheduled Meds:  sodium chloride   Intravenous Once   B-complex with vitamin C  1 tablet Oral Daily   bisacodyl  10 mg Oral Once   bisacodyl  10 mg Rectal Once   carvedilol  3.125 mg Oral BID WC   Chlorhexidine  Gluconate Cloth  6 each Topical Daily   cholecalciferol  1,000 Units Oral Daily   vitamin B-12  1,000 mcg Oral Daily   donepezil  10 mg Oral QHS   feeding supplement  1 Container Oral TID BM   fluticasone furoate-vilanterol  1 puff Inhalation  Daily   isosorbide mononitrate  30 mg Oral Daily   loratadine  10 mg Oral Daily   multivitamin with minerals  1 tablet Oral Daily   potassium chloride  10 mEq Oral Daily   QUEtiapine  25 mg Oral BID   tamsulosin  0.4 mg Oral QPC supper   traZODone  50 mg Oral QHS    PRN meds: acetaminophen **OR** acetaminophen, fluticasone, haloperidol lactate, levalbuterol, LORazepam, magnesium hydroxide, nitroGLYCERIN, ondansetron **OR** ondansetron (ZOFRAN) IV, traZODone   Infusions:   sodium chloride Stopped (09/28/22 1451)   sodium chloride 10 mL/hr at 09/29/22 1635   lactated ringers 100 mL/hr at 09/30/22 1010    Diet:  Diet Order             Diet Heart Room service appropriate? Yes; Fluid consistency: Thin  Diet effective now                   Antimicrobials: Anti-infectives (From admission, onward)    None       Skin assessment:  Pressure Injury 09/27/22 Buttocks Right;Left Stage 1 -  Intact skin with non-blanchable redness of a localized area usually over a bony prominence. (Active)  09/27/22 0540  Location: Buttocks  Location Orientation: Right;Left  Staging: Stage 1 -  Intact skin with non-blanchable redness of a localized area usually over a bony prominence.  Wound Description (Comments):   Present on Admission: Yes      Nutritional status:  Body mass index is 33.12 kg/m.  Nutrition Problem: Inadequate oral intake Etiology: altered GI function Signs/Symptoms: NPO status     Objective: Vitals:   09/30/22 0445 09/30/22 0746  BP: 135/81 (!) 148/87  Pulse: 60 74  Resp: 18 18  Temp: 97.9 F (36.6 C) 97.9 F (36.6 C)  SpO2: 100% 96%    Intake/Output Summary (Last 24 hours) at 09/30/2022 1044 Last data filed at  09/30/2022 1010 Gross per 24 hour  Intake 2149.22 ml  Output 2550 ml  Net -400.78 ml    Filed Weights   09/27/22 0246 09/27/22 0700  Weight: 81.1 kg 79.5 kg   Weight change:  Body mass index is 33.12 kg/m.   Physical Exam: General exam: Pleasant, elderly.  Not in pain Skin: No rashes, lesions or ulcers. HEENT: Atraumatic, normocephalic, no obvious bleeding Lungs: Clear to auscultation bilaterally CVS: Regular rate and rhythm, no murmur GI/Abd soft, nontender, nondistended, bowel sound present CNS: Somnolent.  Tries to open eyes on verbal command.  Gradually waking up this morning. Psychiatry: Mood appropriate Extremities: No pedal edema, no calf tenderness  Data Review: I have personally reviewed the laboratory data and studies available.  F/u labs ordered Unresulted Labs (From admission, onward)     Start     Ordered   09/30/22 1800  Hemoglobin and hematocrit, blood  2 times daily,   TIMED     Question:  Specimen collection method  Answer:  Lab=Lab collect   09/30/22 1029   09/29/22 0500  PSA, total and free  Tomorrow morning,   R       Question:  Specimen collection method  Answer:  Lab=Lab collect   09/28/22 1433            Total time spent in review of labs and imaging, patient evaluation, formulation of plan, documentation and communication with family: 61 minutes  Signed, Terrilee Croak, MD [facility] 09/30/2022

## 2022-09-30 NOTE — Progress Notes (Signed)
Patient moved to 240. Will inform Family in am . Stable condition , unable to complete bowl prep due to agitation/confusion MD aware. Well documented barrier for bowel prep regimen

## 2022-09-30 NOTE — TOC Progression Note (Addendum)
Transition of Care [facility]) - Progression Note    Patient Details  Name: Keith Estes MRN: [id number] Date of Birth: 08-21-1928  Transition of Care [facility] CM/SW Contact  Izola Price, RN Phone Number: 09/30/2022, 4:28 PM  Clinical Narrative:   2/11: Hospice referral made via [facility] consult. Daughter did not have a hospice preference in mind and has a lot of questions about the whole process, but was okay with RN CM reaching out to [facility] for Monday. Notified Raquel James. Simmie Davies RN CM     Expected Discharge Plan: [place] Barriers to Discharge: Continued Medical Work up  Expected Discharge Plan and Services   Discharge Planning Services: CM Consult Post Acute Care Choice: [place] arrangements for the past 2 months: [place]: [place]         Social Determinants of Health (SDOH) Interventions SDOH Screenings   Food Insecurity: No Food Insecurity (09/27/2022)  Housing: Low Risk  (09/27/2022)  Transportation Needs: No Transportation Needs (09/27/2022)  Utilities: Not At Risk (09/27/2022)  Tobacco Use: Low Risk  (09/28/2022)    Readmission Risk Interventions     No data to display

## 2022-09-30 NOTE — Progress Notes (Signed)
Oral medicine was not given this morning,  Patient is too drowsy to take. MD aware.

## 2022-10-01 ENCOUNTER — Encounter: Payer: Self-pay | Admitting: Internal Medicine

## 2022-10-01 DIAGNOSIS — K922 Gastrointestinal hemorrhage, unspecified: Secondary | ICD-10-CM | POA: Diagnosis not present

## 2022-10-01 LAB — CBC
HCT: 23.9 % — ABNORMAL LOW (ref 39.0–52.0)
Hemoglobin: 7.7 g/dL — ABNORMAL LOW (ref 13.0–17.0)
MCH: 29.6 pg (ref 26.0–34.0)
MCHC: 32.2 g/dL (ref 30.0–36.0)
MCV: 91.9 fL (ref 80.0–100.0)
Platelets: 149 10*3/uL — ABNORMAL LOW (ref 150–400)
RBC: 2.6 MIL/uL — ABNORMAL LOW (ref 4.22–5.81)
RDW: 17.7 % — ABNORMAL HIGH (ref 11.5–15.5)
WBC: 5.2 10*3/uL (ref 4.0–10.5)
nRBC: 0.4 % — ABNORMAL HIGH (ref 0.0–0.2)

## 2022-10-01 MED ORDER — ALPRAZOLAM 0.25 MG PO TABS: 0.2500 mg | ORAL_TABLET | Freq: Two times a day (BID) | ORAL | Status: DC | PRN

## 2022-10-01 MED ORDER — ENSURE ENLIVE PO LIQD
237.0000 mL | Freq: Two times a day (BID) | ORAL | Status: DC
  Administered 2022-10-02 – 2022-10-03 (×2): 237 mL via ORAL

## 2022-10-01 NOTE — Progress Notes (Signed)
PROGRESS NOTE  Keith Estes  DOB: [birth year]  PCP: Sallee Lange, NP [id number]  DOA: 09/27/2022  LOS: 4 days  Hospital Day: 5  Brief narrative: Keith Estes is a 89 y.o. male with PMH significant for dementia, COPD, CAD, OSA on CPAP, paroxysmal A-fib on Eliquis, BPH, GERD. 2/8, patient presented to the ED with complaint of acute hematemesis and diarrhea for 2 days  In the ED, patient was hemodynamically stable Labs with hemoglobin 9.9, creatinine 1.75 Patient was started on IV Protonix, IV fluid.  Eliquis was held Admitted to [facility] consulted In the next 24 hours, patient had multiple bloody bowel movements with blood clots.  Hgb dropped to 7.7.   CT angio did not show any extravasation but noted to have an incidental finding of moderate bilateral hydronephrosis and some irregularity and asymmetry of prostate gland, mild to moderate stenosis of both main renal arteries.   Patient was transferred to stepdown unit and transfused 2 units. 2/9, patient underwent EGD that showed Mallory Weiss tear.    Subjective: Patient was seen and examined this morning.   Alert, awake, not restless or agitated but still making some random movements.  Daughter at bedside. Slept well all day yesterday and last night as well.  Much awake today. Able to listen to conversation and responds with short answers.  Able to follow motor commands.  Assessment and plan: Acute GI bleeding  Initially presented with hematemesis and in the first 24 hours had multiple bloody bowel movements EGD 2/9 showed Mallory-Weiss tear.  Colonoscopy was deferred as patient could not complete the preparation and hemoglobin stabilized. Off Eliquis.  Acute blood loss anemia  Chronic anemia due to iron deficiency and vitamin B12 deficiency Hemoglobin downtrending in the last 1 year but was over 10 at baseline.  Presented with a low hemoglobin of 7.7 hemoglobin trend as below.  So far received 2 units of PRBCs.   Hemoglobin has been stable over 8 for 2 days, slightly down today.  Probably dilutional.  Continue to monitor Given IM vitamin B12.  Started on oral Given IV iron.  Start oral supplement prior to discharge. Recent Labs    09/27/22 1648 09/27/22 1653 09/28/22 0448 09/29/22 2020 09/30/22 0143 09/30/22 0619 09/30/22 0820 10/01/22 0326  HGB  --  7.7*   < > 8.8* 8.3* 8.4* 8.4* 7.7*  MCV  --  92.7   < >  --   --  92.0  --  91.9  VITAMINB12 132*  --   --   --   --   --   --   --   FOLATE  --  13.7  --   --   --   --   --   --   FERRITIN  --  16*  --   --   --   --   --   --   TIBC  --  304  --   --   --   --   --   --   IRON  --  37*  --   --   --   --   --   --   RETICCTPCT 1.8  --   --   --   --   --   --   --    < > = values in this interval not displayed.   Paroxysmal atrial fibrillation Rate controlled on Coreg.  Junctional rhythm noted on last EKG done on admission.  2/11 discussed with his daughter.  I recommend to not resume Eliquis considering his age, GI bleeding and hospitalization with complications.  She agrees with the plan.  Acute metabolic encephalopathy  Dementia with behavioral disturbance Acute delirium secondary to acute illness, hospitalization, loss of circadian rhythm.   Mental status better today after about a good sleep yesterday and last night. TeleSitter in place.   AKI Creatinine normal at baseline.  Presented with creatinine elevated 1.75. AKI likely due to combination of diarrhea as well as obstructive uropathy Aggressive IV hydration given.  Creatinine improving.  At risk of volume overload.  Stop IV fluid today.  Diuretics on hold. Continue monitor creatinine Recent Labs    12/17/21 1444 12/18/21 0537 09/27/22 0250 09/27/22 0441 09/28/22 0448 09/29/22 0230 09/30/22 0619  BUN 22 18 33* 34* 43* 31* 19  CREATININE 1.11 0.87 1.75* 1.62* 1.43* 1.47* 1.43*   Bilateral hydronephrosis BPH PTA on Flomax 0.4 mg daily  CT angio 2/8 showed an  incidental finding of bilateral hydronephrosis Urology was consulted.  Family deferred Foley catheter placement. Monitor urine output and renal function Continue Flomax Outpatient follow-up with urology   Bilateral renal artery stenosis:  CT angio showed mild to moderate bilateral renal stenosis.  Incidental finding. Outpatient followup with vascular surgery    Elevated troponin Coronary artery disease Elevated troponin likely demand ischemia and delayed clearance.  No significant delta to suggest ACS. Continue Imdur, Coreg and statin.    Essential hypertension Cardiac meds as above.   Chronic obstructive pulmonary disease (COPD) Change albuterol to Xopenex as needed.   Obesity  Body mass index is 33.12 kg/m. Patient has been advised to make an attempt to improve diet and exercise patterns to aid in weight loss.  Mobility: PT eval obtained.  Goals of care   Code Status: DNR.  Daughter has questions and expectations from home hospice team.  Hospice team to see him today.    DVT prophylaxis:  SCDs Start: 09/27/22 0410   Antimicrobials: None Fluid: Stop IV fluid Consultants: GI Family Communication: Daughter at bedside  Status is: Inpatient Level of care: Telemetry Medical   Dispo: Patient is from: Home              Anticipated d/c is to: Pending clinical course Continue inhospital care because: If his mental status remains stable, possible plan to discharge home with home health services tomorrow    Scheduled Meds:  sodium chloride   Intravenous Once   B-complex with vitamin C  1 tablet Oral Daily   bisacodyl  10 mg Oral Once   bisacodyl  10 mg Rectal Once   carvedilol  3.125 mg Oral BID WC   Chlorhexidine Gluconate Cloth  6 each Topical Daily   cholecalciferol  1,000 Units Oral Daily   vitamin B-12  1,000 mcg Oral Daily   donepezil  10 mg Oral QHS   feeding supplement  1 Container Oral TID BM   fluticasone furoate-vilanterol  1 puff Inhalation Daily    isosorbide mononitrate  30 mg Oral Daily   loratadine  10 mg Oral Daily   multivitamin with minerals  1 tablet Oral Daily   pantoprazole  40 mg Oral BID AC   potassium chloride  10 mEq Oral Daily   tamsulosin  0.4 mg Oral QPC supper   traZODone  50 mg Oral QHS    PRN meds: acetaminophen **OR** acetaminophen, ALPRAZolam, fluticasone, haloperidol lactate, levalbuterol, LORazepam, magnesium hydroxide, nitroGLYCERIN, ondansetron **OR** ondansetron (ZOFRAN) IV, QUEtiapine, traZODone  Infusions:   sodium chloride Stopped (09/28/22 1451)   sodium chloride Stopped (09/30/22 1721)    Diet:  Diet Order             Diet regular Room service appropriate? Yes; Fluid consistency: Thin  Diet effective now                   Antimicrobials: Anti-infectives (From admission, onward)    None       Skin assessment:  Pressure Injury 09/27/22 Buttocks Right;Left Stage 1 -  Intact skin with non-blanchable redness of a localized area usually over a bony prominence. (Active)  09/27/22 0540  Location: Buttocks  Location Orientation: Right;Left  Staging: Stage 1 -  Intact skin with non-blanchable redness of a localized area usually over a bony prominence.  Wound Description (Comments):   Present on Admission: Yes      Nutritional status:  Body mass index is 33.12 kg/m.  Nutrition Problem: Inadequate oral intake Etiology: altered GI function Signs/Symptoms: NPO status     Objective: Vitals:   10/01/22 0744 10/01/22 1133  BP: (!) 166/88 120/74  Pulse: 70 70  Resp: 16 18  Temp: 98.8 F (37.1 C) 97.9 F (36.6 C)  SpO2: 98% 98%    Intake/Output Summary (Last 24 hours) at 10/01/2022 1135 Last data filed at 10/01/2022 0800 Gross per 24 hour  Intake 1962.95 ml  Output 2000 ml  Net -37.05 ml   Filed Weights   09/27/22 0246 09/27/22 0700  Weight: 81.1 kg 79.5 kg   Weight change:  Body mass index is 33.12 kg/m.   Physical Exam: General exam: Pleasant, elderly.  Not in  pain Skin: No rashes, lesions or ulcers. HEENT: Atraumatic, normocephalic, no obvious bleeding Lungs: Clear to auscultation bilaterally CVS: Regular rate and rhythm, no murmur GI/Abd soft, nontender, nondistended, bowel sound present CNS: Alert, awake, able to answer short questions.  Oriented to place only.  Not restless or agitated this morning. Psychiatry: Sad affect Extremities: No pedal edema, no calf tenderness  Data Review: I have personally reviewed the laboratory data and studies available.  F/u labs ordered Unresulted Labs (From admission, onward)     Start     Ordered   10/02/22 [id number]  Basic metabolic panel  Tomorrow morning,   R       Question:  Specimen collection method  Answer:  Lab=Lab collect   10/01/22 1135   10/02/22 0500  CBC with Differential/Platelet  Tomorrow morning,   R       Question:  Specimen collection method  Answer:  Lab=Lab collect   10/01/22 1135   09/29/22 0500  PSA, total and free  Tomorrow morning,   R       Question:  Specimen collection method  Answer:  Lab=Lab collect   09/28/22 1433            Total time spent in review of labs and imaging, patient evaluation, formulation of plan, documentation and communication with family: 57 minutes  Signed, Terrilee Croak, MD [facility] 10/01/2022

## 2022-10-01 NOTE — Progress Notes (Signed)
Manufacturing systems engineer Liaison Note  New referral for hospice services at home received from [facility] yesterday afternoon.  Met with patient and his daughter, Graceann Congress at bedside to discuss hospice Medicare benefit and services.  Cecille Rubin, patient's daughter was also present via telephone.  [name] lives at home with both her parents and is their caregiver. Long conversation about patient's current health status and decline.  Answered questions and concerns they had.  They are not ready to move forward with hospice services at this time and want to discuss it with other family members and their brother.  They feel they will want services, but want the day to think about it.  Notified that hospital liaison would follow up tomorrow and also left my number in case they made the decision later on today.  [facility] services folder left with [employer].  DME needs:  Admitting RN to assess if they proceed with services. DME in home:  hospital bed, rollator and shower bench.  Will continue to collaborate with patient/family, hospital staff and [facility] staff through final disposition.  Thank you for allowing participation in this patient's care.  Dimas Aguas, RN [phone]

## 2022-10-01 NOTE — Plan of Care (Signed)

## 2022-10-01 NOTE — Progress Notes (Signed)
Nutrition Follow-up  DOCUMENTATION CODES:   Obesity unspecified  INTERVENTION:   -D/c Boost Breeze -MVI with minerals daily -Ensure Enlive po BID, each supplement provides 350 kcal and 20 grams of protein  NUTRITION DIAGNOSIS:   Inadequate oral intake related to altered GI function as evidenced by NPO status.  Progressing; advanced to regular diet on 09/30/22  GOAL:   Patient will meet greater than or equal to 90% of their needs  Progressing   MONITOR:   PO intake, Supplement acceptance, Diet advancement  REASON FOR ASSESSMENT:   Malnutrition Screening Tool    ASSESSMENT:   Pt with medical history significant for COPD, coronary artery disease, GERD, chronic bronchitis, OSA on CPAP, paroxysmal atrial fibrillation and osteoarthritis, who presented with acute onset of hematemesis  2/9- s/p EGD- revealed non-bleeding mallory weiss tear 2/10- colonoscopy cancelled secondary to poor prep 2/11- advanced to regular diet  Reviewed I/O's: -2.1 L x 24 hours and +547 ml since admission  UOP: 1.8 L x 24 hours  Rectal tube output: 500 ml x 24 hours   Per MD notes, plan to hold off on colonoscopy.   Pt now on a regular diet. Noted meal completions 100%. He is drinking Boost Breeze supplements.   Per hospice notes, plan to hold off on hospice right now.   Medications reviewed and include dulcolax, vitamin D3, vitamin B-12, and potassium chloride.  Labs reviewed: CBGS: 90.   Diet Order:   Diet Order             Diet regular Room service appropriate? Yes; Fluid consistency: Thin  Diet effective now                   EDUCATION NEEDS:   Not appropriate for education at this time  Skin:  Skin Assessment: Skin Integrity Issues: Skin Integrity Issues:: Stage I Stage I: sacrum  Last BM:  10/01/22 (200 ml via rectal tube)  Height:   Ht Readings from Last 1 Encounters:  09/27/22 5' 1"$  (1.549 m)    Weight:   Wt Readings from Last 1 Encounters:  09/27/22  79.5 kg    Ideal Body Weight:  50.9 kg  BMI:  Body mass index is 33.12 kg/m.  Estimated Nutritional Needs:   Kcal:  1500-1700  Protein:  75-90 grams  Fluid:  > 1.5 L    Loistine Chance, RD, LDN, [place] Registered Dietitian II Certified Diabetes Care and Education Specialist Please refer to [facility] for RD and/or RD on-call/weekend/after hours pager

## 2022-10-01 NOTE — Progress Notes (Signed)
Pt only ate lemon icee and water for dinner. All other food, he would chew and then spit out.

## 2022-10-02 DIAGNOSIS — K922 Gastrointestinal hemorrhage, unspecified: Secondary | ICD-10-CM | POA: Diagnosis not present

## 2022-10-02 LAB — BASIC METABOLIC PANEL
Anion gap: 12 (ref 5–15)
BUN: 13 mg/dL (ref 8–23)
CO2: 20 mmol/L — ABNORMAL LOW (ref 22–32)
Calcium: 8.3 mg/dL — ABNORMAL LOW (ref 8.9–10.3)
Chloride: 109 mmol/L (ref 98–111)
Creatinine, Ser: 1.45 mg/dL — ABNORMAL HIGH (ref 0.61–1.24)
GFR, Estimated: 45 mL/min — ABNORMAL LOW (ref >60)
Glucose, Bld: 128 mg/dL — ABNORMAL HIGH (ref 70–99)
Potassium: 3.4 mmol/L — ABNORMAL LOW (ref 3.5–5.1)
Sodium: 141 mmol/L (ref 135–145)

## 2022-10-02 LAB — CBC WITH DIFFERENTIAL/PLATELET
Abs Immature Granulocytes: 0.05 10*3/uL (ref 0.00–0.07)
Basophils Absolute: 0 10*3/uL (ref 0.0–0.1)
Basophils Relative: 0 %
Eosinophils Absolute: 0.2 10*3/uL (ref 0.0–0.5)
Eosinophils Relative: 2 %
HCT: 26.8 % — ABNORMAL LOW (ref 39.0–52.0)
Hemoglobin: 8.7 g/dL — ABNORMAL LOW (ref 13.0–17.0)
Immature Granulocytes: 1 %
Lymphocytes Relative: 8 %
Lymphs Abs: 0.7 10*3/uL (ref 0.7–4.0)
MCH: 29.9 pg (ref 26.0–34.0)
MCHC: 32.5 g/dL (ref 30.0–36.0)
MCV: 92.1 fL (ref 80.0–100.0)
Monocytes Absolute: 0.8 10*3/uL (ref 0.1–1.0)
Monocytes Relative: 9 %
Neutro Abs: 7.3 10*3/uL (ref 1.7–7.7)
Neutrophils Relative %: 80 %
Platelets: 183 10*3/uL (ref 150–400)
RBC: 2.91 MIL/uL — ABNORMAL LOW (ref 4.22–5.81)
RDW: 17.5 % — ABNORMAL HIGH (ref 11.5–15.5)
WBC: 9.1 10*3/uL (ref 4.0–10.5)
nRBC: 0 % (ref 0.0–0.2)

## 2022-10-02 LAB — GLUCOSE, CAPILLARY: Glucose-Capillary: 128 mg/dL — ABNORMAL HIGH (ref 70–99)

## 2022-10-02 LAB — PSA, TOTAL AND FREE
PSA, Free Pct: 23.4 %
PSA, Free: 9.35 ng/mL
Prostate Specific Ag, Serum: 39.9 ng/mL — ABNORMAL HIGH (ref 0.0–4.0)

## 2022-10-02 MED ORDER — ALPRAZOLAM 0.25 MG PO TABS: 0.2500 mg | ORAL_TABLET | Freq: Two times a day (BID) | ORAL | 0 refills | Status: DC | PRN

## 2022-10-02 MED ORDER — CYANOCOBALAMIN 1000 MCG PO TABS: 1000.0000 ug | ORAL_TABLET | Freq: Every day | ORAL | Status: DC

## 2022-10-02 MED ORDER — BISACODYL 5 MG PO TBEC: 10.0000 mg | DELAYED_RELEASE_TABLET | Freq: Every day | ORAL | 0 refills | Status: DC | PRN

## 2022-10-02 MED ORDER — ENSURE ENLIVE PO LIQD: 237.0000 mL | Freq: Two times a day (BID) | ORAL | 12 refills | Status: DC

## 2022-10-02 MED ORDER — PANTOPRAZOLE SODIUM 40 MG PO TBEC: 40.0000 mg | DELAYED_RELEASE_TABLET | Freq: Two times a day (BID) | ORAL | Status: DC

## 2022-10-02 MED ORDER — QUETIAPINE FUMARATE 25 MG PO TABS: 25.0000 mg | ORAL_TABLET | Freq: Two times a day (BID) | ORAL | Status: DC | PRN

## 2022-10-02 MED ORDER — MAGNESIUM HYDROXIDE 400 MG/5ML PO SUSP: 30.0000 mL | Freq: Every day | ORAL | 0 refills | Status: DC | PRN

## 2022-10-02 NOTE — Progress Notes (Signed)
PROGRESS NOTE  Keith Estes  DOB: Mar 22, 1929  PCP: Sallee Lange, NP [id number]  DOA: 09/27/2022  LOS: 5 days  Hospital Day: 6  Brief narrative: Keith Estes is a 89 y.o. male with PMH significant for dementia, COPD, CAD, OSA on CPAP, paroxysmal A-fib on Eliquis, BPH, GERD. 2/8, patient presented to the ED with complaint of acute hematemesis and diarrhea for 2 days  In the ED, patient was hemodynamically stable Labs with hemoglobin 9.9, creatinine 1.75 Patient was started on IV Protonix, IV fluid.  Eliquis was held Admitted to [place] consulted In the next 24 hours, patient had multiple bloody bowel movements with blood clots.  Hgb dropped to 7.7.   CT angio did not show any extravasation but noted to have an incidental finding of moderate bilateral hydronephrosis and some irregularity and asymmetry of prostate gland, mild to moderate stenosis of both main renal arteries.   Patient was transferred to stepdown unit and transfused 2 units. 2/9, patient underwent EGD that showed Mallory Weiss tear.    Subjective: Patient was seen and examined this morning.   Propped up in bed.  Alert, awake, being fed by nursing staff and daughter. Patient is holding pills in his mouth and not swallowing despite repeated instructions. Daughter is open to consider residential hospice. Hospice liaison nurse to discuss today  Assessment and plan: Acute GI bleeding  Initially presented with hematemesis and in the first 24 hours had multiple bloody bowel movements EGD 2/9 showed Mallory-Weiss tear.  Colonoscopy was deferred as patient could not complete the preparation and hemoglobin stabilized. Off Eliquis.  Acute blood loss anemia  Chronic anemia due to iron deficiency and vitamin B12 deficiency Hemoglobin downtrending in the last 1 year but was over 10 at baseline.  Presented with a low hemoglobin of 7.7 hemoglobin trend as below.  So far received 2 units of PRBCs.  Hemoglobin  stable over 8. Continue to monitor Given IM vitamin B12.  Started on oral Given IV iron.  Start oral supplement prior to discharge. Recent Labs    09/27/22 1648 09/27/22 1653 09/28/22 0448 09/30/22 0143 09/30/22 0619 09/30/22 0820 10/01/22 0326 10/02/22 0520  HGB  --  7.7*   < > 8.3* 8.4* 8.4* 7.7* 8.7*  MCV  --  92.7   < >  --  92.0  --  91.9 92.1  VITAMINB12 132*  --   --   --   --   --   --   --   FOLATE  --  13.7  --   --   --   --   --   --   FERRITIN  --  16*  --   --   --   --   --   --   TIBC  --  304  --   --   --   --   --   --   IRON  --  37*  --   --   --   --   --   --   RETICCTPCT 1.8  --   --   --   --   --   --   --    < > = values in this interval not displayed.   Paroxysmal atrial fibrillation Rate controlled on Coreg.  Junctional rhythm noted on last EKG done on admission. 2/11 discussed with his daughter.  I recommend to not resume Eliquis considering his age, GI bleeding and hospitalization with  complications.  She agrees with the plan.  Acute metabolic encephalopathy  Dementia with behavioral disturbance Acute delirium secondary to acute illness, hospitalization, loss of circadian rhythm.   Mental status gradually improving.  Not restless or agitated but slow to respond.  TeleSitter in place  AKI Creatinine normal at baseline.  Presented with creatinine elevated 1.75. AKI likely due to combination of diarrhea as well as obstructive uropathy Aggressive IV hydration given.  Creatinine improving.  Diuretics on hold.   Continue monitor creatinine Recent Labs    12/17/21 1444 12/18/21 0537 09/27/22 0250 09/27/22 0441 09/28/22 0448 09/29/22 0230 09/30/22 0619 10/02/22 0520  BUN 22 18 33* 34* 43* 31* 19 13  CREATININE 1.11 0.87 1.75* 1.62* 1.43* 1.47* 1.43* 1.45*   Bilateral hydronephrosis BPH PTA on Flomax 0.4 mg daily  CT angio 2/8 showed an incidental finding of bilateral hydronephrosis Urology was consulted.  Family deferred Foley catheter  placement. Monitor urine output and renal function Continue Flomax Outpatient follow-up with urology   Bilateral renal artery stenosis:  CT angio showed mild to moderate bilateral renal stenosis.  Incidental finding. Outpatient followup with vascular surgery    Elevated troponin Coronary artery disease Elevated troponin likely demand ischemia and delayed clearance.   Continue Imdur, Coreg and statin.    Essential hypertension Cardiac meds as above.   Chronic obstructive pulmonary disease (COPD) Change albuterol to Xopenex as needed.   Obesity  Body mass index is 33.12 kg/m. Patient has been advised to make an attempt to improve diet and exercise patterns to aid in weight loss.  Mobility: PT eval obtained.  Weaker than before.  Goals of care   Code Status: DNR.  Daughter is considering residential hospice.    DVT prophylaxis:  SCDs Start: 09/27/22 0410   Antimicrobials: None Fluid: Not on IV fluid Consultants: GI Family Communication: Daughter at bedside  Status is: Inpatient Level of care: Telemetry Medical   Dispo: Patient is from: Home              Anticipated d/c is to: Pending clinical course Continue inhospital care because: If he qualifies for residential hospice, daughter would like that option. Hospice liaison person to check with the medical director.  Scheduled Meds:  sodium chloride   Intravenous Once   B-complex with vitamin C  1 tablet Oral Daily   bisacodyl  10 mg Oral Once   bisacodyl  10 mg Rectal Once   carvedilol  3.125 mg Oral BID WC   Chlorhexidine Gluconate Cloth  6 each Topical Daily   cholecalciferol  1,000 Units Oral Daily   vitamin B-12  1,000 mcg Oral Daily   donepezil  10 mg Oral QHS   feeding supplement  237 mL Oral BID BM   fluticasone furoate-vilanterol  1 puff Inhalation Daily   isosorbide mononitrate  30 mg Oral Daily   loratadine  10 mg Oral Daily   multivitamin with minerals  1 tablet Oral Daily   pantoprazole  40 mg Oral  BID AC   potassium chloride  10 mEq Oral Daily   tamsulosin  0.4 mg Oral QPC supper   traZODone  50 mg Oral QHS    PRN meds: acetaminophen **OR** acetaminophen, ALPRAZolam, fluticasone, haloperidol lactate, levalbuterol, LORazepam, magnesium hydroxide, nitroGLYCERIN, ondansetron **OR** ondansetron (ZOFRAN) IV, QUEtiapine, traZODone   Infusions:   sodium chloride Stopped (09/28/22 1451)   sodium chloride Stopped (09/30/22 1721)    Diet:  Diet Order  Diet regular Room service appropriate? Yes; Fluid consistency: Thin  Diet effective now                   Antimicrobials: Anti-infectives (From admission, onward)    None       Skin assessment:  Pressure Injury 09/27/22 Buttocks Right;Left Stage 1 -  Intact skin with non-blanchable redness of a localized area usually over a bony prominence. (Active)  09/27/22 0540  Location: Buttocks  Location Orientation: Right;Left  Staging: Stage 1 -  Intact skin with non-blanchable redness of a localized area usually over a bony prominence.  Wound Description (Comments):   Present on Admission: Yes      Nutritional status:  Body mass index is 33.12 kg/m.  Nutrition Problem: Inadequate oral intake Etiology: altered GI function Signs/Symptoms: NPO status     Objective: Vitals:   10/02/22 0433 10/02/22 0910  BP: (!) 143/80 107/63  Pulse: 70 69  Resp: 16 (!) 22  Temp: 98.7 F (37.1 C)   SpO2: 98% 97%    Intake/Output Summary (Last 24 hours) at 10/02/2022 1114 Last data filed at 10/02/2022 0900 Gross per 24 hour  Intake 480 ml  Output 1550 ml  Net -1070 ml   Filed Weights   09/27/22 0246 09/27/22 0700  Weight: 81.1 kg 79.5 kg   Weight change:  Body mass index is 33.12 kg/m.   Physical Exam: General exam: Pleasant, elderly.  Not in pain.  Not in pain Skin: No rashes, lesions or ulcers. HEENT: Atraumatic, normocephalic, no obvious bleeding Lungs: Clear to auscultation bilaterally CVS: Regular  rate and rhythm, no murmur GI/Abd soft, nontender, nondistended, bowel sound present CNS: Alert, awake, able to answer short questions.  Weak.  Oriented to place only.  Not restless or agitated this morning. Psychiatry: Sad affect Extremities: No pedal edema, no calf tenderness  Data Review: I have personally reviewed the laboratory data and studies available.  F/u labs ordered Unresulted Labs (From admission, onward)    None       Total time spent in review of labs and imaging, patient evaluation, formulation of plan, documentation and communication with family: 19 minutes  Signed, Terrilee Croak, MD [facility] 10/02/2022

## 2022-10-02 NOTE — Discharge Summary (Signed)
Physician Discharge Summary  Keith Estes [id number] DOB: 06-24-1929 DOA: 09/27/2022  PCP: Sallee Lange, NP  Admit date: 09/27/2022 Discharge date: 10/03/2022  Admitted From: Home Discharge disposition: Residential hospice facility  Recommendations at discharge:  Per hospice policy  Brief narrative: Keith Estes is a 89 y.o. male with PMH significant for dementia, COPD, CAD, OSA on CPAP, paroxysmal A-fib on Eliquis, BPH, GERD. 2/8, patient presented to the ED with complaint of acute hematemesis and diarrhea for 2 days  In the ED, patient was hemodynamically stable Labs with hemoglobin 9.9, creatinine 1.75 Patient was started on IV Protonix, IV fluid.  Eliquis was held Admitted to [place] consulted In the next 24 hours, patient had multiple bloody bowel movements with blood clots.  Hgb dropped to 7.7.   CT angio did not show any extravasation but noted to have an incidental finding of moderate bilateral hydronephrosis and some irregularity and asymmetry of prostate gland, mild to moderate stenosis of both main renal arteries.   Patient was transferred to stepdown unit and transfused 2 units. 2/9, patient underwent EGD that showed Mallory Weiss tear.    His hospitalization was also completed by acute delirium requiring physical and chemical restraints.  Because of his overall decline in physical and mental health, family chose discharge to residential hospice facility  Subjective: Patient was seen and examined this morning.   Propped up in bed.  Alert, awake, being fed by nursing staff and daughter. Patient was holding pills in his mouth and not swallowing despite repeated instructions. Daughter considered residential hospice.  Assessment and plan: Acute GI bleeding  Initially presented with hematemesis and in the first 24 hours had multiple bloody bowel movements EGD 2/9 showed Mallory-Weiss tear.  Colonoscopy was deferred as patient could not complete the  preparation and hemoglobin stabilized. Off Eliquis.  Acute blood loss anemia  Chronic anemia due to iron deficiency and vitamin B12 deficiency Hemoglobin downtrending in the last 1 year but was over 10 at baseline.  Presented with a low hemoglobin of 7.7 hemoglobin trend as below.  So far received 2 units of PRBCs.  Hemoglobin stable over 8. Continue to monitor Oral vitamin B12 and iron supplement to continue if patient is agreeable to oral pills Recent Labs    09/27/22 1648 09/27/22 1653 09/28/22 0448 09/30/22 0143 09/30/22 0619 09/30/22 0820 10/01/22 0326 10/02/22 0520  HGB  --  7.7*   < > 8.3* 8.4* 8.4* 7.7* 8.7*  MCV  --  92.7   < >  --  92.0  --  91.9 92.1  VITAMINB12 132*  --   --   --   --   --   --   --   FOLATE  --  13.7  --   --   --   --   --   --   FERRITIN  --  16*  --   --   --   --   --   --   TIBC  --  304  --   --   --   --   --   --   IRON  --  37*  --   --   --   --   --   --   RETICCTPCT 1.8  --   --   --   --   --   --   --    < > = values in this interval not displayed.   Paroxysmal  atrial fibrillation Rate controlled on Coreg.  Junctional rhythm noted on last EKG done on admission. 2/11 discussed with his daughter.  I recommended to not resume Eliquis considering his age, GI bleeding and hospitalization with complications.  She agreed with the plan.  Acute metabolic encephalopathy  Dementia with behavioral disturbance Acute delirium secondary to acute illness, hospitalization, loss of circadian rhythm.   Mental status gradually workable but still requires a lot of reorientation and total care.  AKI Creatinine normal at baseline.  Presented with creatinine elevated 1.75. AKI likely due to combination of diarrhea as well as obstructive uropathy Aggressive IV hydration given.  Creatinine improving.  Diuretics held.  I would not resume them at discharge. Continue monitor creatinine Recent Labs    12/17/21 1444 12/18/21 0537 09/27/22 0250 09/27/22 0441  09/28/22 0448 09/29/22 0230 09/30/22 0619 10/02/22 0520  BUN 22 18 33* 34* 43* 31* 19 13  CREATININE 1.11 0.87 1.75* 1.62* 1.43* 1.47* 1.43* 1.45*   Bilateral hydronephrosis BPH PTA on Flomax 0.4 mg daily  CT angio 2/8 showed an incidental finding of bilateral hydronephrosis Urology was consulted.  Family deferred Foley catheter placement. Monitor urine output and renal function Continue Flomax Outpatient follow-up with urology if desired   Bilateral renal artery stenosis:  CT angio showed mild to moderate bilateral renal stenosis.  Incidental finding. Outpatient followup with vascular surgery if desired    Elevated troponin Coronary artery disease Elevated troponin likely demand ischemia and delayed clearance.   Continue Imdur, Coreg and statin.  However, patient has been not able to tolerate oral pills.    Essential hypertension Cardiac meds as above.   Chronic obstructive pulmonary disease (COPD) Change albuterol to Xopenex as needed.  Impaired mobility Requires assistance with total care.   Goals of care   Code Status: DNR.  Daughter is considering residential hospice.  Wounds:  - Incision (Closed) 03/26/17 Groin Right (Active)  Date First Assessed/Time First Assessed: 03/26/17 1509   Location: Groin  Location Orientation: Right    Assessments 03/26/2017  3:50 PM 03/26/2017  5:58 PM  Dressing Type Liquid skin adhesive Liquid skin adhesive  Dressing Clean;Dry;Intact Clean;Dry;Intact  Drainage Amount -- None     No associated orders.     Wound / Incision (Open or Dehisced) 12/17/21 Skin tear Elbow Left;Lower;Posterior;Proximal (Active)  Date First Assessed/Time First Assessed: 12/17/21 2245   Wound Type: Skin tear  Location: Elbow  Location Orientation: Left;Lower;Posterior;Proximal  Present on Admission: Yes    Assessments 12/17/2021 10:45 PM 12/18/2021  8:00 AM  Dressing Type Foam - Lift dressing to assess site every shift Foam - Lift dressing to assess site every  shift  Dressing Changed New --  Dressing Status Clean, Dry, Intact Clean, Dry, Intact  Dressing Change Frequency PRN --  Site / Wound Assessment -- Dressing in place / Unable to assess  Drainage Amount Scant --  Drainage Description Serosanguineous --  Treatment Cleansed --     No associated orders.     Pressure Injury 09/27/22 Buttocks Right;Left Stage 1 -  Intact skin with non-blanchable redness of a localized area usually over a bony prominence. (Active)  Date First Assessed/Time First Assessed: 09/27/22 0540   Location: Buttocks  Location Orientation: Right;Left  Staging: Stage 1 -  Intact skin with non-blanchable redness of a localized area usually over a bony prominence.  Present on Admission: Yes    Assessments 09/27/2022  8:55 AM 10/01/2022  8:12 PM  Dressing Type Foam - Lift dressing to assess site  every shift Foam - Lift dressing to assess site every shift  Dressing Clean, Dry, Intact Clean, Dry, Intact  Dressing Change Frequency PRN --  Site / Wound Assessment Clean;Dry;Pink;Red --  Drainage Amount None None     No associated orders.    Discharge Exam:   Vitals:   10/01/22 2318 10/02/22 0433 10/02/22 0910 10/02/22 1232  BP: 135/76 (!) 143/80 107/63 103/72  Pulse: 73 70 69 70  Resp: 16 16 (!) 22 20  Temp: 98.2 F (36.8 C) 98.7 F (37.1 C)  98.9 F (37.2 C)  TempSrc: Oral Oral  Axillary  SpO2: 98% 98% 97% 99%  Weight:      Height:        Body mass index is 33.12 kg/m.   General exam: Pleasant, elderly.  Not in pain.  Not in pain Skin: No rashes, lesions or ulcers. HEENT: Atraumatic, normocephalic, no obvious bleeding Lungs: Clear to auscultation bilaterally CVS: Regular rate and rhythm, no murmur GI/Abd soft, nontender, nondistended, bowel sound present CNS: Alert, awake, able to answer short questions.  Weak.  Oriented to place only.  Not restless or agitated this morning. Psychiatry: Sad affect Extremities: No pedal edema, no calf tenderness  Follow ups:     Follow-up Information     Gauger, Victoriano Lain, NP Follow up.   Specialty: Internal Medicine Contact information: [place] [address] [phone]                 Discharge Instructions:   Discharge Instructions     Call MD for:  difficulty breathing, headache or visual disturbances   Complete by: As directed    Call MD for:  extreme fatigue   Complete by: As directed    Call MD for:  hives   Complete by: As directed    Call MD for:  persistant dizziness or light-headedness   Complete by: As directed    Call MD for:  persistant nausea and vomiting   Complete by: As directed    Call MD for:  severe uncontrolled pain   Complete by: As directed    Call MD for:  temperature >100.4   Complete by: As directed    Diet general   Complete by: As directed    Discharge instructions   Complete by: As directed    General discharge instructions: Follow with Primary MD Gauger, Victoriano Lain, NP in 7 days  Please request your PCP  to go over your hospital tests, procedures, radiology results at the follow up. Please get your medicines reviewed and adjusted.  Your PCP may decide to repeat certain labs or tests as needed. Do not drive, operate heavy machinery, perform activities at heights, swimming or participation in water activities or provide baby sitting services if your were admitted for syncope or siezures until you have seen by Primary MD or a Neurologist and advised to do so again. [place] Controlled Substance Reporting System database was reviewed. Do not drive, operate heavy machinery, perform activities at heights, swim, participate in water activities or provide baby-sitting services while on medications for pain, sleep and mood until your outpatient physician has reevaluated you and advised to do so again.  You are strongly recommended to comply with the dose, frequency and duration of prescribed medications. Activity: As tolerated with Full fall  precautions use walker/cane & assistance as needed Avoid using any recreational substances like cigarette, tobacco, alcohol, or non-prescribed drug. If you experience worsening of your admission symptoms, develop  shortness of breath, life threatening emergency, suicidal or homicidal thoughts you must seek medical attention immediately by calling 911 or calling your MD immediately  if symptoms less severe. You must read complete instructions/literature along with all the possible adverse reactions/side effects for all the medicines you take and that have been prescribed to you. Take any new medicine only after you have completely understood and accepted all the possible adverse reactions/side effects.  Wear Seat belts while driving. You were cared for by a hospitalist during your hospital stay. If you have any questions about your discharge medications or the care you received while you were in the hospital after you are discharged, you can call the unit and ask to speak with the hospitalist or the covering physician. Once you are discharged, your primary care physician will handle any further medical issues. Please note that NO REFILLS for any discharge medications will be authorized once you are discharged, as it is imperative that you return to your primary care physician (or establish a relationship with a primary care physician if you do not have one).   Discharge wound care:   Complete by: As directed    Increase activity slowly   Complete by: As directed        Discharge Medications:   Allergies as of 10/02/2022       Reactions   Tramadol Nausea And Vomiting   Diltiazem Other (See Comments)   Headache, GI upset   Hydrocodone Nausea And Vomiting   Lipitor [atorvastatin] Other (See Comments)   Myalgias   Lisinopril Cough   Percodan [oxycodone-aspirin] Nausea And Vomiting   Vicodin [hydrocodone-acetaminophen] Nausea And Vomiting        Medication List     STOP taking these  medications    apixaban 5 MG Tabs tablet Commonly known as: ELIQUIS   omeprazole 40 MG capsule Commonly known as: PRILOSEC   torsemide 10 MG tablet Commonly known as: DEMADEX       TAKE these medications    albuterol (2.5 MG/3ML) 0.083% nebulizer solution Commonly known as: PROVENTIL Take 2.5 mg by nebulization every 6 (six) hours as needed for wheezing or shortness of breath.   ALPRAZolam 0.25 MG tablet Commonly known as: XANAX Take 1 tablet (0.25 mg total) by mouth 2 (two) times daily as needed for anxiety.   B-complex with vitamin C tablet Take 1 tablet by mouth daily.   BIOFLEX PO Take 1 tablet by mouth 2 (two) times daily.   bisacodyl 5 MG EC tablet Commonly known as: DULCOLAX Take 2 tablets (10 mg total) by mouth daily as needed for moderate constipation.   Breo Ellipta 200-25 MCG/ACT Aepb Generic drug: fluticasone furoate-vilanterol Inhale 1 puff into the lungs daily. Rinse mouth after use.   carvedilol 3.125 MG tablet Commonly known as: COREG Take 3.125 mg by mouth 2 (two) times daily with a meal.   cyanocobalamin 1000 MCG tablet Take 1 tablet (1,000 mcg total) by mouth daily. Start taking on: October 03, 2022   donepezil 10 MG tablet Commonly known as: ARICEPT Take 10 mg by mouth at bedtime.   famotidine 40 MG tablet Commonly known as: PEPCID Take 40 mg by mouth at bedtime.   feeding supplement Liqd Take 237 mLs by mouth 2 (two) times daily between meals. Start taking on: October 03, 2022   fexofenadine 180 MG tablet Commonly known as: ALLEGRA Take 180 mg by mouth daily.   fluticasone 50 MCG/ACT nasal spray Commonly known as: FLONASE Place 1 spray  into both nostrils 2 (two) times daily as needed for allergies or rhinitis.   isosorbide mononitrate 30 MG 24 hr tablet Commonly known as: IMDUR Take 30 mg by mouth daily.   magnesium hydroxide 400 MG/5ML suspension Commonly known as: MILK OF MAGNESIA Take 30 mLs by mouth daily as needed for  mild constipation.   nitroGLYCERIN 0.4 MG SL tablet Commonly known as: NITROSTAT Place 1 tablet (0.4 mg total) under the tongue every 5 (five) minutes as needed for chest pain.   pantoprazole 40 MG tablet Commonly known as: PROTONIX Take 1 tablet (40 mg total) by mouth 2 (two) times daily before a meal.   Potassium Chloride CR 8 MEQ Cpcr capsule CR Commonly known as: MICRO-K TAKE 1 CAPSULE(8 MEQ) BY MOUTH EVERY DAY   PRESERVISION AREDS 2 PO Take 1 tablet by mouth 2 (two) times daily.   QUEtiapine 25 MG tablet Commonly known as: SEROQUEL Take 1 tablet (25 mg total) by mouth 3 times/day as needed-between meals & bedtime (agitation).   tamsulosin 0.4 MG Caps capsule Commonly known as: FLOMAX Take 0.4 mg by mouth daily after supper.   traZODone 50 MG tablet Commonly known as: DESYREL Take 50 mg by mouth at bedtime.   UNABLE TO FIND Tumeric 1 tablet daily   URINOZINC PO Take 1 tablet by mouth 2 (two) times daily.   Vitamin D-3 25 MCG (1000 UT) Caps Take 1 capsule by mouth daily.               Discharge Care Instructions  (From admission, onward)           Start     Ordered   10/02/22 0000  Discharge wound care:        10/02/22 1414             The results of significant diagnostics from this hospitalization (including imaging, microbiology, ancillary and laboratory) are listed below for reference.    Procedures and Diagnostic Studies:   CT ANGIO GI BLEED  Result Date: 09/27/2022 CLINICAL DATA:  Bloody stools, GI bleed EXAM: CTA ABDOMEN AND PELVIS WITHOUT AND WITH CONTRAST TECHNIQUE: Multidetector CT imaging of the abdomen and pelvis was performed using the standard protocol during bolus administration of intravenous contrast. Multiplanar reconstructed images and MIPs were obtained and reviewed to evaluate the vascular anatomy. RADIATION DOSE REDUCTION: This exam was performed according to the departmental dose-optimization program which includes  automated exposure control, adjustment of the mA and/or kV according to patient size and/or use of iterative reconstruction technique. CONTRAST:  64m OMNIPAQUE IOHEXOL 350 MG/ML SOLN COMPARISON:  None Available. FINDINGS: Due to a technical issue, the arterial phase imaging stops at the iliac crests and accordingly does not cover the pelvis. As result, pelvic vessels and assessment for pelvic GI bleeding is substantially reduced. I was not comfortable given the patient a second dose of contrast given the underlying renal insufficiency, GFR of 40. VASCULAR Aorta: Mild atheromatous vascular calcifications. Celiac: Atheromatous plaque dorsally along the origin of the celiac artery without flow-limiting stenosis. Atheromatous plaque noted in the splenic artery. SMA: Atheromatous plaque proximally in the SMA without significant stenosis or occlusion. No clot in the visualized branches of the SMA. Renals: Combination of hard and soft plaque medially in both main renal arteries. This may cause mild to moderate stenosis but no occlusion. There is a small accessory artery of the left kidney feeding the upper pole. IMA: Narrowed origin with atheromatous plaque in the vicinity, but with contrast in  a vessel favoring patency. Inflow: Not characterized in arterial phase due to technical issues mentioned above. Portal venous phase characterization demonstrates notable atheromatous vascular calcification in the iliac arteries without substantial narrowing/occlusion. There is some fusiform areas of larger caliber including the right common iliac artery at 2.3 cm and the right internal iliac artery at 1.7 cm. Proximal Outflow: Atheromatous calcification, no occlusion Veins: Unremarkable Review of the MIP images confirms the above findings. NON-VASCULAR Lower chest: Dependent subsegmental atelectasis and scattered bulla. Mild bilateral airway thickening and mild cylindrical bronchiectasis in the lower lobes. Mitral and aortic valve  calcifications. Pacer leads noted. Mild to moderate cardiomegaly. Coronary artery atherosclerosis. Hepatobiliary: Unremarkable Pancreas: Unremarkable Spleen: Unremarkable Adrenals/Urinary Tract: Moderate to prominent right hydronephrosis and right hydroureter extending down to indistinct density in the distal ureter, cannot exclude tumor or blood clot. Moderate left hydronephrosis and hydroureter extending down to the UVJ. Irregular prostate gland indents the urinary bladder and appears asymmetric, prostate cancer not excluded. Scattered renal cysts all have benign imaging characteristics. No further imaging workup of these lesions is indicated. Fullness of the adrenal glands without discrete mass. Stomach/Bowel: Extensive material in the stomach favoring recent meal. Sigmoid colon diverticulosis. There a frothy air fluid level in the rectum suggesting diarrheal process. Faint perirectal stranding. As noted above, our our teary ule phase images regrettably only include the upper abdomen but not the pelvis due to technical issues. I do not observe a site of active bleeding into the bowel. Lymphatic: No pathologic adenopathy observed. Reproductive: Irregular and asymmetric prostate gland. Other: No supplemental non-categorized findings. Musculoskeletal: Subacute fracture of the right anterior sixth rib on image 5 series 10. Bilateral degenerative hip arthropathy. Lumbar spondylosis and degenerative disc disease. IMPRESSION: 1. No site of active bleeding into the bowel is identified. There is some frothy air-fluid level in the rectum suggesting diarrheal process. 2. Bilateral hydronephrosis and hydroureter extending down to the UVJ. There is some indistinct density in the distal right ureter which could be from tumor or blood clot. 3. Irregular and asymmetric prostate gland indents the urinary bladder and prostate cancer is not excluded. Correlate with PSA levels. 4. Aortoiliac and systemic atherosclerosis without  critical stenosis in the major vessels of the abdomen/pelvis identified. 5. Mild to moderate stenosis of both main renal arteries due to mixed and soft plaque. 6. Mild to moderate cardiomegaly. Coronary atherosclerosis. Mitral and aortic valve calcifications. 7. Subacute fracture of the right anterior sixth rib. 8. Bilateral airway thickening and mild cylindrical bronchiectasis in the lower lobes. 9. Sigmoid colon diverticulosis. 10. Lumbar spondylosis and degenerative disc disease. 11. Bilateral renal cysts all have benign imaging characteristics. No further imaging workup of these lesions is indicated. 12. Due to a technical issue, the arterial phase imaging stops at the iliac crests and accordingly does not cover the pelvis. Aortic Atherosclerosis (ICD10-I70.0). Electronically Signed   By: Van Clines M.D.   On: 09/27/2022 18:10   DG Chest Port 1 View  Result Date: 09/27/2022 CLINICAL DATA:  Vomiting blood since 1,800. Patient is on Eliquis. Weakness. EXAM: PORTABLE CHEST 1 VIEW COMPARISON:  02/22/2018 FINDINGS: Cardiac pacemaker. Shallow inspiration. Heart size and pulmonary vascularity are normal. No airspace disease or consolidation in the lungs. No pleural effusions. No pneumothorax. Mediastinal contours appear intact. Old appearing right rib fractures. IMPRESSION: Shallow inspiration.  No evidence of active pulmonary disease. Electronically Signed   By: Lucienne Capers M.D.   On: 09/27/2022 03:25     Labs:   Basic Metabolic Panel: Recent  Labs  Lab 09/27/22 0441 09/28/22 0448 09/29/22 0230 09/30/22 0619 10/02/22 0520  NA 140 141 137 140 141  K 3.8 4.3 4.0 4.0 3.4*  CL 111 117* 110 114* 109  CO2 21* 18* 23 20* 20*  GLUCOSE 100* 97 95 89 128*  BUN 34* 43* 31* 19 13  CREATININE 1.62* 1.43* 1.47* 1.43* 1.45*  CALCIUM 8.1* 7.8* 8.2* 8.4* 8.3*  MG  --  2.3 2.2  --   --   PHOS  --  2.5 2.4*  --   --    GFR Estimated Creatinine Clearance: 29.7 mL/min (A) (by C-G formula based on  SCr of 1.45 mg/dL (H)). Liver Function Tests: Recent Labs  Lab 09/27/22 0250 09/28/22 0448 09/29/22 0230  AST 29  --  24  ALT 20  --  16  ALKPHOS 81  --  58  BILITOT 1.3*  --  0.9  PROT 7.1  --  5.9*  ALBUMIN 3.5 2.7* 2.9*   Recent Labs  Lab 09/27/22 0250  LIPASE 37   No results for input(s): "AMMONIA" in the last 168 hours. Coagulation profile Recent Labs  Lab 09/27/22 0250  INR 1.8*    CBC: Recent Labs  Lab 09/27/22 0250 09/27/22 0441 09/28/22 0448 09/28/22 1431 09/29/22 0230 09/29/22 0755 09/30/22 0143 09/30/22 0619 09/30/22 0820 10/01/22 0326 10/02/22 0520  WBC 7.0   < > 4.5  --  4.9  --   --  5.9  --  5.2 9.1  NEUTROABS 5.1  --   --   --   --   --   --  3.9  --   --  7.3  HGB 9.9*   < > 8.0*   < > 8.6*   < > 8.3* 8.4* 8.4* 7.7* 8.7*  HCT 31.2*   < > 25.2*   < > 26.8*   < > 25.5* 26.3* 26.4* 23.9* 26.8*  MCV 92.9   < > 91.3  --  91.2  --   --  92.0  --  91.9 92.1  PLT 195   < > 148*  --  151  --   --  154  --  149* 183   < > = values in this interval not displayed.   Cardiac Enzymes: No results for input(s): "CKTOTAL", "CKMB", "CKMBINDEX", "TROPONINI" in the last 168 hours. BNP: Invalid input(s): "POCBNP" CBG: Recent Labs  Lab 09/27/22 1753 09/30/22 1005  GLUCAP 105* 90   D-Dimer No results for input(s): "DDIMER" in the last 72 hours. Hgb A1c No results for input(s): "HGBA1C" in the last 72 hours. Lipid Profile No results for input(s): "CHOL", "HDL", "LDLCALC", "TRIG", "CHOLHDL", "LDLDIRECT" in the last 72 hours. Thyroid function studies No results for input(s): "TSH", "T4TOTAL", "T3FREE", "THYROIDAB" in the last 72 hours.  Invalid input(s): "FREET3" Anemia work up No results for input(s): "VITAMINB12", "FOLATE", "FERRITIN", "TIBC", "IRON", "RETICCTPCT" in the last 72 hours. Microbiology Recent Results (from the past 240 hour(s))  Resp panel by RT-PCR (RSV, Flu A&B, Covid) Anterior Nasal Swab     Status: None   Collection Time: 09/27/22   2:50 AM   Specimen: Anterior Nasal Swab  Result Value Ref Range Status   SARS Coronavirus 2 by RT PCR NEGATIVE NEGATIVE Final    Comment: (NOTE) SARS-CoV-2 target nucleic acids are NOT DETECTED.  The SARS-CoV-2 RNA is generally detectable in upper respiratory specimens during the acute phase of infection. The lowest concentration of SARS-CoV-2 viral copies this assay can detect  is 138 copies/mL. A negative result does not preclude SARS-Cov-2 infection and should not be used as the sole basis for treatment or other patient management decisions. A negative result may occur with  improper specimen collection/handling, submission of specimen other than nasopharyngeal swab, presence of viral mutation(s) within the areas targeted by this assay, and inadequate number of viral copies(<138 copies/mL). A negative result must be combined with clinical observations, patient history, and epidemiological information. The expected result is Negative.  Fact Sheet for Patients:  EntrepreneurPulse.com.au  Fact Sheet for Healthcare Providers:  IncredibleEmployment.be  This test is no t yet approved or cleared by the [place] FDA and  has been authorized for detection and/or diagnosis of SARS-CoV-2 by FDA under an Emergency Use Authorization (EUA). This EUA will remain  in effect (meaning this test can be used) for the duration of the COVID-19 declaration under Section 564(b)(1) of the Act, 21 U.S.C.section 360bbb-3(b)(1), unless the authorization is terminated  or revoked sooner.       Influenza A by PCR NEGATIVE NEGATIVE Final   Influenza B by PCR NEGATIVE NEGATIVE Final    Comment: (NOTE) The Xpert Xpress SARS-CoV-2/FLU/RSV plus assay is intended as an aid in the diagnosis of influenza from Nasopharyngeal swab specimens and should not be used as a sole basis for treatment. Nasal washings and aspirates are unacceptable for Xpert Xpress  SARS-CoV-2/FLU/RSV testing.  Fact Sheet for Patients: EntrepreneurPulse.com.au  Fact Sheet for Healthcare Providers: IncredibleEmployment.be  This test is not yet approved or cleared by the [place] FDA and has been authorized for detection and/or diagnosis of SARS-CoV-2 by FDA under an Emergency Use Authorization (EUA). This EUA will remain in effect (meaning this test can be used) for the duration of the COVID-19 declaration under Section 564(b)(1) of the Act, 21 U.S.C. section 360bbb-3(b)(1), unless the authorization is terminated or revoked.     Resp Syncytial Virus by PCR NEGATIVE NEGATIVE Final    Comment: (NOTE) Fact Sheet for Patients: EntrepreneurPulse.com.au  Fact Sheet for Healthcare Providers: IncredibleEmployment.be  This test is not yet approved or cleared by the [place] FDA and has been authorized for detection and/or diagnosis of SARS-CoV-2 by FDA under an Emergency Use Authorization (EUA). This EUA will remain in effect (meaning this test can be used) for the duration of the COVID-19 declaration under Section 564(b)(1) of the Act, 21 U.S.C. section 360bbb-3(b)(1), unless the authorization is terminated or revoked.  Performed at [facility], [address] [place], [place] [address]   MRSA Next Gen by PCR, Nasal     Status: None   Collection Time: 09/27/22  6:17 PM   Specimen: Nasal Mucosa; Nasal Swab  Result Value Ref Range Status   MRSA by PCR Next Gen NOT DETECTED NOT DETECTED Final    Comment: (NOTE) The GeneXpert MRSA Assay (FDA approved for NASAL specimens only), is one component of a comprehensive MRSA colonization surveillance program. It is not intended to diagnose MRSA infection nor to guide or monitor treatment for MRSA infections. Test performance is not FDA approved in patients less than 76 years old. Performed at [facility], [address], [place], [place] [address]     Time coordinating discharge: 35 minutes  Signed: Terrilee Croak  [facility] 10/02/2022, 2:14 PM

## 2022-10-02 NOTE — Progress Notes (Signed)
Physical Therapy Treatment Patient Details Name: Keith Estes MRN: [id number] DOB: 1929-06-25 Today's Date: 10/02/2022   History of Present Illness Patient is a 89 year old with history of coronary artery disease, hypertension, A-fib on chronic anticoagulation with Eliquis who presented with hematochezia as well as hematemesis. History of dementia.    PT Comments    Pt struggled to initiate a lot of movement despite heavy cuing/assist from PT and daughter.  He needed assist with all transitions and at times needed very heavy assist.  Once heavily assisted to standing he managed to maintain standing balance with heavy reliance on the walker, constant cuing (verbal, tactile) and direct assist to maintain safety standing at EOB.  Pt's O2 remained in the high 90s with the effort, but he functionally was very limited, especially when compared to how daughter describes his up ad lib to use the bathroom, etc baseline in the recent past.  Pt is unsafe to return home at this point needing higher level of care.   Recommendations for follow up therapy are one component of a multi-disciplinary discharge planning process, led by the attending physician.  Recommendations may be updated based on patient status, additional functional criteria and insurance authorization.  Follow Up Recommendations  Skilled nursing-short term rehab (<3 hours/day) (per ability to participate) Can patient physically be transported by private vehicle: No   Assistance Recommended at Discharge Frequent or constant Supervision/Assistance  Patient can return home with the following Help with stairs or ramp for entrance;Assist for transportation;Assistance with feeding;Assistance with cooking/housework;Two people to help with bathing/dressing/bathroom;Two people to help with walking and/or transfers   Equipment Recommendations  None recommended by PT    Recommendations for Other Services       Precautions / Restrictions  Precautions Precautions: Fall Restrictions Weight Bearing Restrictions: No     Mobility  Bed Mobility Overal bed mobility: Needs Assistance Bed Mobility: Supine to Sit, Sit to Supine, Rolling     Supine to sit: Max assist Sit to supine: Total assist   General bed mobility comments: Pt unable to initiate any real movement with simple cuing, seemed to do a little when tactile/min cues given but could not maintain transitions to/from sitting.  Total assist to get back to supine    Transfers Overall transfer level: Needs assistance Equipment used: Rolling walker (2 wheels) Transfers: Sit to/from Stand Sit to Stand: Max assist           General transfer comment: Pt again unable to initiate movement, raised bed ~3" and gave very heavy assist verbal and phyiscal to attain standing in walker    Ambulation/Gait               General Gait Details: no true ambulation, unsafe to try.  he did manage to take a few highly cued side steps along EOB and little forward/back "steps" but is nowhere near his typically mobile baseline - unsafe to try   Stairs             Wheelchair Mobility    Modified Rankin (Stroke Patients Only)       Balance Overall balance assessment: Needs assistance Sitting-balance support: No upper extremity supported, Feet supported Sitting balance-Leahy Scale: Poor       Standing balance-Leahy Scale: Poor Standing balance comment: maintained static standing at EOB with walker and close assist, but cognition big limiter for safety in standing  Cognition Arousal/Alertness: Lethargic Behavior During Therapy: Restless Overall Cognitive Status: Impaired/Different from baseline                                 General Comments: per daughter he typically is able to follow basic instructions and do purposful voluntary movements        Exercises      General Comments General comments (skin  integrity, edema, etc.): Pt typically able to follow instructions well and move ad lib, pt far from his basleine at this point      Pertinent Vitals/Pain Pain Assessment Pain Assessment: Faces Faces Pain Scale: No hurt    Home Living                          Prior Function            PT Goals (current goals can now be found in the care plan section) Progress towards PT goals: Progressing toward goals    Frequency    Min 2X/week      PT Plan Current plan remains appropriate    Co-evaluation              AM-PAC PT "6 Clicks" Mobility   Outcome Measure  Help needed turning from your back to your side while in a flat bed without using bedrails?: A Lot Help needed moving from lying on your back to sitting on the side of a flat bed without using bedrails?: A Lot Help needed moving to and from a bed to a chair (including a wheelchair)?: Total Help needed standing up from a chair using your arms (e.g., wheelchair or bedside chair)?: Total Help needed to walk in hospital room?: Total Help needed climbing 3-5 steps with a railing? : Total 6 Click Score: 8    End of Session Equipment Utilized During Treatment: Gait belt Activity Tolerance: Patient limited by fatigue;Patient limited by lethargy (confusion) Patient left: in bed;with call bell/phone within reach;with bed alarm set;with SCD's reapplied Nurse Communication: Mobility status PT Visit Diagnosis: Muscle weakness (generalized) (M62.81);Other abnormalities of gait and mobility (R26.89)     Time: 1100-1126 PT Time Calculation (min) (ACUTE ONLY): 26 min  Charges:  $Therapeutic Activity: 23-37 mins                     Kreg Shropshire, DPT 10/02/2022, 2:06 PM

## 2022-10-02 NOTE — Progress Notes (Signed)
[facility]) Hospice hospital liaison note   Mr. Gillyard was initially referred for hospice at home however after further decline referral was switched to hospice home referral. Met in room with patient and daughter and services were explained.    Bed was offered for today but daughter has requested transfer tomorrow.     Updated attending and [facility] manager via [place].  Thank you for the opportunity to participate in this patient's care Jhonnie Garner, BSN, RN Hospice nurse liaison [phone]

## 2022-10-03 ENCOUNTER — Encounter: Payer: Self-pay | Admitting: Family Medicine

## 2022-10-03 DIAGNOSIS — K922 Gastrointestinal hemorrhage, unspecified: Secondary | ICD-10-CM | POA: Diagnosis not present

## 2022-10-03 LAB — GLUCOSE, CAPILLARY
Glucose-Capillary: 114 mg/dL — ABNORMAL HIGH (ref 70–99)
Glucose-Capillary: 91 mg/dL (ref 70–99)

## 2022-10-03 NOTE — Care Management Important Message (Signed)
Important Message  Patient Details  Name: Keith Estes MRN: [id number] Date of Birth: 02/06/1929   Medicare Important Message Given:  Other (see comment)  Disposition to discharge with hospice services.  Medicare IM withheld at this time out of respect for patient and family.    [name] 10/03/2022, 8:26 AM

## 2022-10-03 NOTE — Discharge Summary (Signed)
Physician Discharge Summary   Patient: Keith Estes MRN: [id number] DOB: [birth year]  Admit date:     09/27/2022  Discharge date: 10/03/22  Discharge Physician: Jennye Boroughs   PCP: Sallee Lange, NP   Recommendations at discharge:   Follow-up with hospice team within 24 hours of discharge  Discharge Diagnoses: Principal Problem:   GI bleed Active Problems:   Chronic a-fib (HCC)   AKI (acute kidney injury) ([place])   Coronary artery disease   Chronic obstructive pulmonary disease (COPD) ([place])   Essential hypertension   BPH (benign prostatic hyperplasia)   Dementia with behavioral disturbance (HCC)   Acute blood loss anemia   Pressure injury of skin   Diarrhea   Elevated troponin   Bilateral hydronephrosis   Renal artery stenosis, native, bilateral ([place])   Hematochezia  Resolved Problems:   * No resolved hospital problems. *  Hospital Course:  Keith Estes is a 89 y.o. male with PMH significant for dementia, COPD, CAD, OSA on CPAP, paroxysmal A-fib on Eliquis, BPH, GERD. 2/8, patient presented to the ED with complaint of acute hematemesis and diarrhea for 2 days   In the ED, patient was hemodynamically stable Labs with hemoglobin 9.9, creatinine 1.75 Patient was started on IV Protonix, IV fluid.  Eliquis was held Admitted to [facility] consulted In the next 24 hours, patient had multiple bloody bowel movements with blood clots.  Hgb dropped to 7.7.   CT angio did not show any extravasation but noted to have an incidental finding of moderate bilateral hydronephrosis and some irregularity and asymmetry of prostate gland, mild to moderate stenosis of both main renal arteries.   Patient was transferred to stepdown unit and transfused 2 units. 2/9, patient underwent EGD that showed Mallory Weiss tear.     Hospital course was complicated by acute delirium requiring physical and chemical restraints.  Because of his overall decline in physical and mental health, family  requested discharge to hospice home. Discharge plan discussed with Barbera Setters, daughter, at the bedside.   Assessment and Plan:  Acute GI bleeding  Initially presented with hematemesis and in the first 24 hours had multiple bloody bowel movements EGD 2/9 showed Mallory-Weiss tear.  Colonoscopy was deferred as patient could not complete the preparation and hemoglobin stabilized. Off Eliquis.   Acute blood loss anemia  Chronic anemia due to iron deficiency and vitamin B12 deficiency Hemoglobin downtrending in the last 1 year but was over 10 at baseline.  Hemoglobin was 7.7 on admission.  He was transfused with 2 units of packed red blood cells.    Recent Labs (within last 365 days)            Recent Labs    09/27/22 1648 09/27/22 1653 09/28/22 0448 09/30/22 0143 09/30/22 0619 09/30/22 0820 10/01/22 0326 10/02/22 0520  HGB  --  7.7*   < > 8.3* 8.4* 8.4* 7.7* 8.7*  MCV  --  92.7   < >  --  92.0  --  91.9 92.1  VITAMINB12 132*  --   --   --   --   --   --   --   FOLATE  --  13.7  --   --   --   --   --   --   FERRITIN  --  16*  --   --   --   --   --   --   TIBC  --  304  --   --   --   --   --   --  IRON  --  37*  --   --   --   --   --   --   RETICCTPCT 1.8  --   --   --   --   --   --   --    < > = values in this interval not displayed.      Paroxysmal atrial fibrillation Eliquis was discontinued    Acute metabolic encephalopathy  Dementia with behavioral disturbance Overall decline in mental status     AKI Creatinine is stable around baseline  Recent Labs (within last 365 days)            Recent Labs    12/17/21 1444 12/18/21 0537 09/27/22 0250 09/27/22 0441 09/28/22 0448 09/29/22 0230 09/30/22 0619 10/02/22 0520  BUN 22 18 33* 34* 43* 31* 19 13  CREATININE 1.11 0.87 1.75* 1.62* 1.43* 1.47* 1.43* 1.45*      Bilateral hydronephrosis BPH PTA on Flomax 0.4 mg daily  CT angio 2/8 showed an incidental finding of bilateral hydronephrosis Urology was  consulted.  Family deferred Foley catheter placement.   Bilateral renal artery stenosis:  CT angio showed mild to moderate bilateral renal stenosis.  Incidental finding.   Elevated troponin Coronary artery disease Elevated troponin likely demand ischemia and delayed clearance.   Continue Imdur, Coreg and statin.  However, patient has been not able to tolerate oral pills.  Other comorbidities include hypertension, COPD, frailty                  Consultants: Gastroenterologist Procedures performed: EGD Disposition:  Hospice house Diet recommendation:  Discharge Diet Orders (From admission, onward)     Start     Ordered   10/02/22 0000  Diet general        10/02/22 1414           Regular diet DISCHARGE MEDICATION: Allergies as of 10/03/2022       Reactions   Tramadol Nausea And Vomiting   Diltiazem Other (See Comments)   Headache, GI upset   Hydrocodone Nausea And Vomiting   Lipitor [atorvastatin] Other (See Comments)   Myalgias   Lisinopril Cough   Percodan [oxycodone-aspirin] Nausea And Vomiting   Vicodin [hydrocodone-acetaminophen] Nausea And Vomiting        Medication List     STOP taking these medications    apixaban 5 MG Tabs tablet Commonly known as: ELIQUIS   omeprazole 40 MG capsule Commonly known as: PRILOSEC   torsemide 10 MG tablet Commonly known as: DEMADEX       TAKE these medications    albuterol (2.5 MG/3ML) 0.083% nebulizer solution Commonly known as: PROVENTIL Take 2.5 mg by nebulization every 6 (six) hours as needed for wheezing or shortness of breath.   ALPRAZolam 0.25 MG tablet Commonly known as: XANAX Take 1 tablet (0.25 mg total) by mouth 2 (two) times daily as needed for anxiety.   B-complex with vitamin C tablet Take 1 tablet by mouth daily.   BIOFLEX PO Take 1 tablet by mouth 2 (two) times daily.   bisacodyl 5 MG EC tablet Commonly known as: DULCOLAX Take 2 tablets (10 mg total) by mouth daily as  needed for moderate constipation.   Breo Ellipta 200-25 MCG/ACT Aepb Generic drug: fluticasone furoate-vilanterol Inhale 1 puff into the lungs daily. Rinse mouth after use.   carvedilol 3.125 MG tablet Commonly known as: COREG Take 3.125 mg by mouth 2 (two) times daily with a meal.   cyanocobalamin 1000 MCG  tablet Take 1 tablet (1,000 mcg total) by mouth daily.   donepezil 10 MG tablet Commonly known as: ARICEPT Take 10 mg by mouth at bedtime.   famotidine 40 MG tablet Commonly known as: PEPCID Take 40 mg by mouth at bedtime.   feeding supplement Liqd Take 237 mLs by mouth 2 (two) times daily between meals.   fexofenadine 180 MG tablet Commonly known as: ALLEGRA Take 180 mg by mouth daily.   fluticasone 50 MCG/ACT nasal spray Commonly known as: FLONASE Place 1 spray into both nostrils 2 (two) times daily as needed for allergies or rhinitis.   isosorbide mononitrate 30 MG 24 hr tablet Commonly known as: IMDUR Take 30 mg by mouth daily.   magnesium hydroxide 400 MG/5ML suspension Commonly known as: MILK OF MAGNESIA Take 30 mLs by mouth daily as needed for mild constipation.   nitroGLYCERIN 0.4 MG SL tablet Commonly known as: NITROSTAT Place 1 tablet (0.4 mg total) under the tongue every 5 (five) minutes as needed for chest pain.   pantoprazole 40 MG tablet Commonly known as: PROTONIX Take 1 tablet (40 mg total) by mouth 2 (two) times daily before a meal.   Potassium Chloride CR 8 MEQ Cpcr capsule CR Commonly known as: MICRO-K TAKE 1 CAPSULE(8 MEQ) BY MOUTH EVERY DAY   PRESERVISION AREDS 2 PO Take 1 tablet by mouth 2 (two) times daily.   QUEtiapine 25 MG tablet Commonly known as: SEROQUEL Take 1 tablet (25 mg total) by mouth 3 times/day as needed-between meals & bedtime (agitation).   tamsulosin 0.4 MG Caps capsule Commonly known as: FLOMAX Take 0.4 mg by mouth daily after supper.   traZODone 50 MG tablet Commonly known as: DESYREL Take 50 mg by mouth at  bedtime.   UNABLE TO FIND Tumeric 1 tablet daily   URINOZINC PO Take 1 tablet by mouth 2 (two) times daily.   Vitamin D-3 25 MCG (1000 UT) Caps Take 1 capsule by mouth daily.               Discharge Care Instructions  (From admission, onward)           Start     Ordered   10/02/22 0000  Discharge wound care:        10/02/22 1414            Follow-up Information     Gauger, Victoriano Lain, NP Follow up.   Specialty: Internal Medicine Contact information: [place] [id number] [phone]                Discharge Exam: Danley Danker Weights   09/27/22 0246 09/27/22 0700  Weight: 81.1 kg 79.5 kg   GEN: NAD SKIN: Warm and dry EYES: EOMI ENT: MMM CV: RRR PULM: CTA B ABD: soft, ND, NT, +BS CNS: AAO x 1 (person), non focal EXT: No edema or tenderness Rectal tube in place (draining black stools)  Condition at discharge: stable  The results of significant diagnostics from this hospitalization (including imaging, microbiology, ancillary and laboratory) are listed below for reference.   Imaging Studies: CT ANGIO GI BLEED  Result Date: 09/27/2022 CLINICAL DATA:  Bloody stools, GI bleed EXAM: CTA ABDOMEN AND PELVIS WITHOUT AND WITH CONTRAST TECHNIQUE: Multidetector CT imaging of the abdomen and pelvis was performed using the standard protocol during bolus administration of intravenous contrast. Multiplanar reconstructed images and MIPs were obtained and reviewed to evaluate the vascular anatomy. RADIATION DOSE REDUCTION: This exam was performed according to the departmental  dose-optimization program which includes automated exposure control, adjustment of the mA and/or kV according to patient size and/or use of iterative reconstruction technique. CONTRAST:  51m OMNIPAQUE IOHEXOL 350 MG/ML SOLN COMPARISON:  None Available. FINDINGS: Due to a technical issue, the arterial phase imaging stops at the iliac crests and accordingly does not cover the  pelvis. As result, pelvic vessels and assessment for pelvic GI bleeding is substantially reduced. I was not comfortable given the patient a second dose of contrast given the underlying renal insufficiency, GFR of 40. VASCULAR Aorta: Mild atheromatous vascular calcifications. Celiac: Atheromatous plaque dorsally along the origin of the celiac artery without flow-limiting stenosis. Atheromatous plaque noted in the splenic artery. SMA: Atheromatous plaque proximally in the SMA without significant stenosis or occlusion. No clot in the visualized branches of the SMA. Renals: Combination of hard and soft plaque medially in both main renal arteries. This may cause mild to moderate stenosis but no occlusion. There is a small accessory artery of the left kidney feeding the upper pole. IMA: Narrowed origin with atheromatous plaque in the vicinity, but with contrast in a vessel favoring patency. Inflow: Not characterized in arterial phase due to technical issues mentioned above. Portal venous phase characterization demonstrates notable atheromatous vascular calcification in the iliac arteries without substantial narrowing/occlusion. There is some fusiform areas of larger caliber including the right common iliac artery at 2.3 cm and the right internal iliac artery at 1.7 cm. Proximal Outflow: Atheromatous calcification, no occlusion Veins: Unremarkable Review of the MIP images confirms the above findings. NON-VASCULAR Lower chest: Dependent subsegmental atelectasis and scattered bulla. Mild bilateral airway thickening and mild cylindrical bronchiectasis in the lower lobes. Mitral and aortic valve calcifications. Pacer leads noted. Mild to moderate cardiomegaly. Coronary artery atherosclerosis. Hepatobiliary: Unremarkable Pancreas: Unremarkable Spleen: Unremarkable Adrenals/Urinary Tract: Moderate to prominent right hydronephrosis and right hydroureter extending down to indistinct density in the distal ureter, cannot exclude  tumor or blood clot. Moderate left hydronephrosis and hydroureter extending down to the UVJ. Irregular prostate gland indents the urinary bladder and appears asymmetric, prostate cancer not excluded. Scattered renal cysts all have benign imaging characteristics. No further imaging workup of these lesions is indicated. Fullness of the adrenal glands without discrete mass. Stomach/Bowel: Extensive material in the stomach favoring recent meal. Sigmoid colon diverticulosis. There a frothy air fluid level in the rectum suggesting diarrheal process. Faint perirectal stranding. As noted above, our our teary ule phase images regrettably only include the upper abdomen but not the pelvis due to technical issues. I do not observe a site of active bleeding into the bowel. Lymphatic: No pathologic adenopathy observed. Reproductive: Irregular and asymmetric prostate gland. Other: No supplemental non-categorized findings. Musculoskeletal: Subacute fracture of the right anterior sixth rib on image 5 series 10. Bilateral degenerative hip arthropathy. Lumbar spondylosis and degenerative disc disease. IMPRESSION: 1. No site of active bleeding into the bowel is identified. There is some frothy air-fluid level in the rectum suggesting diarrheal process. 2. Bilateral hydronephrosis and hydroureter extending down to the UVJ. There is some indistinct density in the distal right ureter which could be from tumor or blood clot. 3. Irregular and asymmetric prostate gland indents the urinary bladder and prostate cancer is not excluded. Correlate with PSA levels. 4. Aortoiliac and systemic atherosclerosis without critical stenosis in the major vessels of the abdomen/pelvis identified. 5. Mild to moderate stenosis of both main renal arteries due to mixed and soft plaque. 6. Mild to moderate cardiomegaly. Coronary atherosclerosis. Mitral and aortic valve calcifications. 7.  Subacute fracture of the right anterior sixth rib. 8. Bilateral airway  thickening and mild cylindrical bronchiectasis in the lower lobes. 9. Sigmoid colon diverticulosis. 10. Lumbar spondylosis and degenerative disc disease. 11. Bilateral renal cysts all have benign imaging characteristics. No further imaging workup of these lesions is indicated. 12. Due to a technical issue, the arterial phase imaging stops at the iliac crests and accordingly does not cover the pelvis. Aortic Atherosclerosis (ICD10-I70.0). Electronically Signed   By: Van Clines M.D.   On: 09/27/2022 18:10   DG Chest Port 1 View  Result Date: 09/27/2022 CLINICAL DATA:  Vomiting blood since 1,800. Patient is on Eliquis. Weakness. EXAM: PORTABLE CHEST 1 VIEW COMPARISON:  02/22/2018 FINDINGS: Cardiac pacemaker. Shallow inspiration. Heart size and pulmonary vascularity are normal. No airspace disease or consolidation in the lungs. No pleural effusions. No pneumothorax. Mediastinal contours appear intact. Old appearing right rib fractures. IMPRESSION: Shallow inspiration.  No evidence of active pulmonary disease. Electronically Signed   By: Lucienne Capers M.D.   On: 09/27/2022 03:25    Microbiology: Results for orders placed or performed during the hospital encounter of 09/27/22  Resp panel by RT-PCR (RSV, Flu A&B, Covid) Anterior Nasal Swab     Status: None   Collection Time: 09/27/22  2:50 AM   Specimen: Anterior Nasal Swab  Result Value Ref Range Status   SARS Coronavirus 2 by RT PCR NEGATIVE NEGATIVE Final    Comment: (NOTE) SARS-CoV-2 target nucleic acids are NOT DETECTED.  The SARS-CoV-2 RNA is generally detectable in upper respiratory specimens during the acute phase of infection. The lowest concentration of SARS-CoV-2 viral copies this assay can detect is 138 copies/mL. A negative result does not preclude SARS-Cov-2 infection and should not be used as the sole basis for treatment or other patient management decisions. A negative result may occur with  improper specimen  collection/handling, submission of specimen other than nasopharyngeal swab, presence of viral mutation(s) within the areas targeted by this assay, and inadequate number of viral copies(<138 copies/mL). A negative result must be combined with clinical observations, patient history, and epidemiological information. The expected result is Negative.  Fact Sheet for Patients:  EntrepreneurPulse.com.au  Fact Sheet for Healthcare Providers:  IncredibleEmployment.be  This test is no t yet approved or cleared by the [place] FDA and  has been authorized for detection and/or diagnosis of SARS-CoV-2 by FDA under an Emergency Use Authorization (EUA). This EUA will remain  in effect (meaning this test can be used) for the duration of the COVID-19 declaration under Section 564(b)(1) of the Act, 21 U.S.C.section 360bbb-3(b)(1), unless the authorization is terminated  or revoked sooner.       Influenza A by PCR NEGATIVE NEGATIVE Final   Influenza B by PCR NEGATIVE NEGATIVE Final    Comment: (NOTE) The Xpert Xpress SARS-CoV-2/FLU/RSV plus assay is intended as an aid in the diagnosis of influenza from Nasopharyngeal swab specimens and should not be used as a sole basis for treatment. Nasal washings and aspirates are unacceptable for Xpert Xpress SARS-CoV-2/FLU/RSV testing.  Fact Sheet for Patients: EntrepreneurPulse.com.au  Fact Sheet for Healthcare Providers: IncredibleEmployment.be  This test is not yet approved or cleared by the [place] FDA and has been authorized for detection and/or diagnosis of SARS-CoV-2 by FDA under an Emergency Use Authorization (EUA). This EUA will remain in effect (meaning this test can be used) for the duration of the COVID-19 declaration under Section 564(b)(1) of the Act, 21 U.S.C. section 360bbb-3(b)(1), unless the authorization is terminated  or revoked.     Resp Syncytial  Virus by PCR NEGATIVE NEGATIVE Final    Comment: (NOTE) Fact Sheet for Patients: EntrepreneurPulse.com.au  Fact Sheet for Healthcare Providers: IncredibleEmployment.be  This test is not yet approved or cleared by the [place] FDA and has been authorized for detection and/or diagnosis of SARS-CoV-2 by FDA under an Emergency Use Authorization (EUA). This EUA will remain in effect (meaning this test can be used) for the duration of the COVID-19 declaration under Section 564(b)(1) of the Act, 21 U.S.C. section 360bbb-3(b)(1), unless the authorization is terminated or revoked.  Performed at [facility], [address] [place], [place] [address]   MRSA Next Gen by PCR, Nasal     Status: None   Collection Time: 09/27/22  6:17 PM   Specimen: Nasal Mucosa; Nasal Swab  Result Value Ref Range Status   MRSA by PCR Next Gen NOT DETECTED NOT DETECTED Final    Comment: (NOTE) The GeneXpert MRSA Assay (FDA approved for NASAL specimens only), is one component of a comprehensive MRSA colonization surveillance program. It is not intended to diagnose MRSA infection nor to guide or monitor treatment for MRSA infections. Test performance is not FDA approved in patients less than 30 years old. Performed at [facility], [address] [place], [place] [address]     Labs: CBC: Recent Labs  Lab 09/27/22 0250 09/27/22 0441 09/28/22 0448 09/28/22 1431 09/29/22 0230 09/29/22 0755 09/30/22 0143 09/30/22 0619 09/30/22 0820 10/01/22 0326 10/02/22 0520  WBC 7.0   < > 4.5  --  4.9  --   --  5.9  --  5.2 9.1  NEUTROABS 5.1  --   --   --   --   --   --  3.9  --   --  7.3  HGB 9.9*   < > 8.0*   < > 8.6*   < > 8.3* 8.4* 8.4* 7.7* 8.7*  HCT 31.2*   < > 25.2*   < > 26.8*   < > 25.5* 26.3* 26.4* 23.9* 26.8*  MCV 92.9   < > 91.3  --  91.2  --   --  92.0  --  91.9 92.1  PLT 195   < > 148*  --  151  --   --  154  --  149* 183   < > = values  in this interval not displayed.   Basic Metabolic Panel: Recent Labs  Lab 09/27/22 0441 09/28/22 0448 09/29/22 0230 09/30/22 0619 10/02/22 0520  NA 140 141 137 140 141  K 3.8 4.3 4.0 4.0 3.4*  CL 111 117* 110 114* 109  CO2 21* 18* 23 20* 20*  GLUCOSE 100* 97 95 89 128*  BUN 34* 43* 31* 19 13  CREATININE 1.62* 1.43* 1.47* 1.43* 1.45*  CALCIUM 8.1* 7.8* 8.2* 8.4* 8.3*  MG  --  2.3 2.2  --   --   PHOS  --  2.5 2.4*  --   --    Liver Function Tests: Recent Labs  Lab 09/27/22 0250 09/28/22 0448 09/29/22 0230  AST 29  --  24  ALT 20  --  16  ALKPHOS 81  --  58  BILITOT 1.3*  --  0.9  PROT 7.1  --  5.9*  ALBUMIN 3.5 2.7* 2.9*   CBG: Recent Labs  Lab 09/27/22 1753 09/30/22 1005 10/02/22 2039 10/03/22 0000 10/03/22 0335  GLUCAP 105* 90 128* 114* 91    Discharge  time spent: greater than 30 minutes.  Signed: Jennye Boroughs, MD [facility] 10/03/2022

## 2022-10-03 NOTE — Plan of Care (Signed)

## 2023-02-18 DEATH — deceased
